# Patient Record
Sex: Male | Born: 1940
Health system: Southern US, Community
[De-identification: ages and names within clinical notes are randomized; demographics above are authoritative.]

## PROBLEM LIST (undated history)

## (undated) DIAGNOSIS — J449 Chronic obstructive pulmonary disease, unspecified: Secondary | ICD-10-CM

## (undated) DIAGNOSIS — F988 Other specified behavioral and emotional disorders with onset usually occurring in childhood and adolescence: Secondary | ICD-10-CM

## (undated) DIAGNOSIS — E785 Hyperlipidemia, unspecified: Secondary | ICD-10-CM

## (undated) DIAGNOSIS — K219 Gastro-esophageal reflux disease without esophagitis: Secondary | ICD-10-CM

## (undated) DIAGNOSIS — I10 Essential (primary) hypertension: Secondary | ICD-10-CM

## (undated) DIAGNOSIS — IMO0001 Reserved for inherently not codable concepts without codable children: Secondary | ICD-10-CM

## (undated) DIAGNOSIS — J302 Other seasonal allergic rhinitis: Secondary | ICD-10-CM

## (undated) DIAGNOSIS — N320 Bladder-neck obstruction: Secondary | ICD-10-CM

## (undated) DIAGNOSIS — M1990 Unspecified osteoarthritis, unspecified site: Secondary | ICD-10-CM

## (undated) DIAGNOSIS — C801 Malignant (primary) neoplasm, unspecified: Secondary | ICD-10-CM

## (undated) DIAGNOSIS — F419 Anxiety disorder, unspecified: Secondary | ICD-10-CM

## (undated) HISTORY — PX: OTHER SURGICAL HISTORY: SHX169

## (undated) HISTORY — PX: HERNIA REPAIR: SHX51

## (undated) HISTORY — DX: Essential (primary) hypertension: I10

## (undated) HISTORY — PX: APPENDECTOMY: SHX54

## (undated) HISTORY — DX: Anxiety disorder, unspecified: F41.9

## (undated) HISTORY — PX: TONSILLECTOMY: SUR1361

---

## 2006-06-07 ENCOUNTER — Ambulatory Visit: Payer: Self-pay | Admitting: Gastroenterology

## 2010-02-20 ENCOUNTER — Emergency Department: Payer: Self-pay | Admitting: Emergency Medicine

## 2010-03-14 ENCOUNTER — Ambulatory Visit: Payer: Self-pay | Admitting: Surgery

## 2010-03-16 ENCOUNTER — Ambulatory Visit: Payer: Self-pay | Admitting: Surgery

## 2010-03-20 ENCOUNTER — Ambulatory Visit: Payer: Self-pay | Admitting: Surgery

## 2011-10-16 ENCOUNTER — Ambulatory Visit: Payer: Self-pay | Admitting: Unknown Physician Specialty

## 2011-10-18 LAB — PATHOLOGY REPORT

## 2013-10-01 DIAGNOSIS — J302 Other seasonal allergic rhinitis: Secondary | ICD-10-CM | POA: Insufficient documentation

## 2013-10-01 DIAGNOSIS — F988 Other specified behavioral and emotional disorders with onset usually occurring in childhood and adolescence: Secondary | ICD-10-CM | POA: Insufficient documentation

## 2013-10-01 DIAGNOSIS — K219 Gastro-esophageal reflux disease without esophagitis: Secondary | ICD-10-CM | POA: Insufficient documentation

## 2013-10-01 DIAGNOSIS — N320 Bladder-neck obstruction: Secondary | ICD-10-CM | POA: Insufficient documentation

## 2013-10-01 DIAGNOSIS — E7800 Pure hypercholesterolemia, unspecified: Secondary | ICD-10-CM | POA: Insufficient documentation

## 2014-01-12 ENCOUNTER — Ambulatory Visit: Payer: Self-pay

## 2014-01-13 DIAGNOSIS — R918 Other nonspecific abnormal finding of lung field: Secondary | ICD-10-CM | POA: Insufficient documentation

## 2014-01-13 DIAGNOSIS — J449 Chronic obstructive pulmonary disease, unspecified: Secondary | ICD-10-CM | POA: Insufficient documentation

## 2014-02-11 DIAGNOSIS — I10 Essential (primary) hypertension: Secondary | ICD-10-CM | POA: Insufficient documentation

## 2014-06-08 DIAGNOSIS — M720 Palmar fascial fibromatosis [Dupuytren]: Secondary | ICD-10-CM | POA: Insufficient documentation

## 2014-12-14 DIAGNOSIS — Z86010 Personal history of colonic polyps: Secondary | ICD-10-CM | POA: Insufficient documentation

## 2014-12-14 DIAGNOSIS — Z860101 Personal history of adenomatous and serrated colon polyps: Secondary | ICD-10-CM | POA: Insufficient documentation

## 2014-12-14 DIAGNOSIS — R490 Dysphonia: Secondary | ICD-10-CM | POA: Insufficient documentation

## 2014-12-14 DIAGNOSIS — IMO0001 Reserved for inherently not codable concepts without codable children: Secondary | ICD-10-CM | POA: Insufficient documentation

## 2015-01-06 ENCOUNTER — Other Ambulatory Visit: Payer: Self-pay | Admitting: Specialist

## 2015-01-06 DIAGNOSIS — R0602 Shortness of breath: Secondary | ICD-10-CM

## 2015-01-06 DIAGNOSIS — R911 Solitary pulmonary nodule: Secondary | ICD-10-CM

## 2015-01-27 ENCOUNTER — Encounter: Payer: Self-pay | Admitting: *Deleted

## 2015-01-28 ENCOUNTER — Encounter
Admission: RE | Disposition: A | Discharge status: Home / Self Care | Payer: Self-pay | Source: Ambulatory Visit | Attending: Unknown Physician Specialty

## 2015-01-28 ENCOUNTER — Encounter: Payer: Self-pay | Admitting: *Deleted

## 2015-01-28 ENCOUNTER — Ambulatory Visit
Admission: RE | Admit: 2015-01-28 | Discharge: 2015-01-28 | Disposition: A | Discharge status: Home / Self Care | Payer: PPO | Source: Ambulatory Visit | Attending: Unknown Physician Specialty | Admitting: Unknown Physician Specialty

## 2015-01-28 ENCOUNTER — Ambulatory Visit: Payer: PPO | Admitting: Anesthesiology

## 2015-01-28 DIAGNOSIS — K640 First degree hemorrhoids: Secondary | ICD-10-CM | POA: Diagnosis not present

## 2015-01-28 DIAGNOSIS — K449 Diaphragmatic hernia without obstruction or gangrene: Secondary | ICD-10-CM | POA: Diagnosis not present

## 2015-01-28 DIAGNOSIS — Z7951 Long term (current) use of inhaled steroids: Secondary | ICD-10-CM | POA: Insufficient documentation

## 2015-01-28 DIAGNOSIS — K5730 Diverticulosis of large intestine without perforation or abscess without bleeding: Secondary | ICD-10-CM | POA: Diagnosis not present

## 2015-01-28 DIAGNOSIS — E785 Hyperlipidemia, unspecified: Secondary | ICD-10-CM | POA: Insufficient documentation

## 2015-01-28 DIAGNOSIS — K210 Gastro-esophageal reflux disease with esophagitis: Secondary | ICD-10-CM | POA: Insufficient documentation

## 2015-01-28 DIAGNOSIS — D127 Benign neoplasm of rectosigmoid junction: Secondary | ICD-10-CM | POA: Diagnosis not present

## 2015-01-28 DIAGNOSIS — J449 Chronic obstructive pulmonary disease, unspecified: Secondary | ICD-10-CM | POA: Diagnosis not present

## 2015-01-28 DIAGNOSIS — K2980 Duodenitis without bleeding: Secondary | ICD-10-CM | POA: Diagnosis not present

## 2015-01-28 DIAGNOSIS — Z87891 Personal history of nicotine dependence: Secondary | ICD-10-CM | POA: Diagnosis not present

## 2015-01-28 DIAGNOSIS — Z79899 Other long term (current) drug therapy: Secondary | ICD-10-CM | POA: Insufficient documentation

## 2015-01-28 DIAGNOSIS — R12 Heartburn: Secondary | ICD-10-CM | POA: Diagnosis present

## 2015-01-28 DIAGNOSIS — K2950 Unspecified chronic gastritis without bleeding: Secondary | ICD-10-CM | POA: Insufficient documentation

## 2015-01-28 HISTORY — DX: Hyperlipidemia, unspecified: E78.5

## 2015-01-28 HISTORY — PX: COLONOSCOPY WITH PROPOFOL: SHX5780

## 2015-01-28 HISTORY — DX: Bladder-neck obstruction: N32.0

## 2015-01-28 HISTORY — PX: ESOPHAGOGASTRODUODENOSCOPY (EGD) WITH PROPOFOL: SHX5813

## 2015-01-28 HISTORY — DX: Gastro-esophageal reflux disease without esophagitis: K21.9

## 2015-01-28 HISTORY — DX: Chronic obstructive pulmonary disease, unspecified: J44.9

## 2015-01-28 HISTORY — DX: Reserved for inherently not codable concepts without codable children: IMO0001

## 2015-01-28 SURGERY — COLONOSCOPY WITH PROPOFOL
Anesthesia: General

## 2015-01-28 MED ORDER — SODIUM CHLORIDE 0.9 % IV SOLN
INTRAVENOUS | Status: DC
  Administered 2015-01-28: 1000 mL via INTRAVENOUS

## 2015-01-28 MED ORDER — IPRATROPIUM-ALBUTEROL 0.5-2.5 (3) MG/3ML IN SOLN
RESPIRATORY_TRACT | Status: AC
  Filled 2015-01-28: qty 3

## 2015-01-28 MED ORDER — SODIUM CHLORIDE 0.9 % IV SOLN
INTRAVENOUS | Status: DC
Start: 2015-01-28 — End: 2015-01-28

## 2015-01-28 MED ORDER — LIDOCAINE HCL (CARDIAC) 20 MG/ML IV SOLN
INTRAVENOUS | Status: DC | PRN
  Administered 2015-01-28: 80 mg via INTRAVENOUS

## 2015-01-28 MED ORDER — IPRATROPIUM-ALBUTEROL 0.5-2.5 (3) MG/3ML IN SOLN
3.0000 mL | Freq: Once | RESPIRATORY_TRACT | Status: AC
Start: 2015-01-28 — End: 2015-01-28
  Administered 2015-01-28: 3 mL via RESPIRATORY_TRACT

## 2015-01-28 MED ORDER — PROPOFOL 500 MG/50ML IV EMUL
INTRAVENOUS | Status: DC | PRN
  Administered 2015-01-28: 160 ug/kg/min via INTRAVENOUS

## 2015-01-28 MED ORDER — MIDAZOLAM HCL 2 MG/2ML IJ SOLN
INTRAMUSCULAR | Status: DC | PRN
  Administered 2015-01-28: 2 mg via INTRAVENOUS

## 2015-01-28 NOTE — H&P (Signed)
Primary Care Physician:  Sherrin Daisy, MD Primary Gastroenterologist:  Dr. Vira Agar  Pre-Procedure History & Physical: HPI:  Arthur Owens is a 74 y.o. male is here for an endoscopy and colonoscopy.   Past Medical History  Diagnosis Date  . Hyperlipidemia   . GERD (gastroesophageal reflux disease)   . COPD (chronic obstructive pulmonary disease)   . Bladder neck obstruction   . Shortness of breath dyspnea     Past Surgical History  Procedure Laterality Date  . Adentamous colon polyp    . Hernia repair    . Left hernia repair Left   . Left humerus debridement Left   . Appendectomy    . Tonsillectomy      Prior to Admission medications   Medication Sig Start Date End Date Taking? Authorizing Arthur Owens  albuterol (PROVENTIL HFA;VENTOLIN HFA) 108 (90 BASE) MCG/ACT inhaler Inhale 2 puffs into the lungs every 6 (six) hours as needed for wheezing or shortness of breath.   Yes Historical Arthur Chestnut, MD  atorvastatin (LIPITOR) 40 MG tablet Take 40 mg by mouth daily.   Yes Historical Arthur Melnik, MD  atorvastatin (LIPITOR) 40 MG tablet Take 40 mg by mouth daily.   Yes Historical Arthur Daw, MD  fluticasone (FLONASE) 50 MCG/ACT nasal spray Place 2 sprays into both nostrils daily.   Yes Historical Arthur Bhattacharyya, MD  hydrochlorothiazide (HYDRODIURIL) 12.5 MG tablet Take 12.5 mg by mouth daily.   Yes Historical Arthur Geissinger, MD  methylphenidate (METADATE CD) 20 MG CR capsule Take 20 mg by mouth every morning.   Yes Historical Arthur Brabant, MD  omeprazole (PRILOSEC) 20 MG capsule Take 20 mg by mouth daily.   Yes Historical Arthur Spielberg, MD  tamsulosin (FLOMAX) 0.4 MG CAPS capsule Take 0.4 mg by mouth.   Yes Historical Arthur Husted, MD  Umeclidinium-Vilanterol (ANORO ELLIPTA) 62.5-25 MCG/INH AEPB Inhale into the lungs.   Yes Historical Arthur Aguillard, MD  beclomethasone (QVAR) 40 MCG/ACT inhaler Inhale 2 puffs into the lungs 2 (two) times daily.    Historical Arthur Maggio, MD    Allergies as of 12/21/2014  . (Not on File)     History reviewed. No pertinent family history.  Social History   Social History  . Marital Status: Married    Spouse Name: N/A  . Number of Children: N/A  . Years of Education: N/A   Occupational History  . Not on file.   Social History Main Topics  . Smoking status: Former Research scientist (life sciences)  . Smokeless tobacco: Not on file  . Alcohol Use: Not on file  . Drug Use: Not on file  . Sexual Activity: Not on file   Other Topics Concern  . Not on file   Social History Narrative    Review of Systems: See HPI, otherwise negative ROS  Physical Exam: BP 152/99 mmHg  Pulse 75  Temp(Src) 96.9 F (36.1 C) (Tympanic)  Resp 20  Ht 5\' 7"  (1.702 m)  Wt 89.812 kg (198 lb)  BMI 31.00 kg/m2  SpO2 96% General:   Alert,  pleasant and cooperative in NAD Head:  Normocephalic and atraumatic. Neck:  Supple; no masses or thyromegaly. Lungs:  Clear throughout to auscultation.    Heart:  Regular rate and rhythm. Abdomen:  Soft, nontender and nondistended. Normal bowel sounds, without guarding, and without rebound.   Neurologic:  Alert and  oriented x4;  grossly normal neurologically.  Impression/Plan: Arthur Owens is here for an endoscopy and colonoscopy to be performed for [facility] colon polyps, Heartburn  Risks, benefits, limitations, and alternatives regarding  endoscopy  and colonoscopy have been reviewed with the patient.  Questions have been answered.  All parties agreeable.   Gaylyn Cheers, MD  01/28/2015, 11:16 AM

## 2015-01-28 NOTE — Anesthesia Preprocedure Evaluation (Signed)
Anesthesia Evaluation  Patient identified by MRN, date of birth, ID band Patient awake    Reviewed: Allergy & Precautions, H&P , NPO status , Patient's Chart, lab work & pertinent test results  History of Anesthesia Complications Negative for: history of anesthetic complications  Airway Mallampati: II  TM Distance: >3 FB Neck ROM: limited    Dental  (+) Poor Dentition   Pulmonary neg shortness of breath, COPD, former smoker,    Pulmonary exam normal breath sounds clear to auscultation       Cardiovascular Exercise Tolerance: Good (-) Past MI Normal cardiovascular exam Rhythm:regular Rate:Normal     Neuro/Psych negative neurological ROS  negative psych ROS   GI/Hepatic Neg liver ROS, GERD  Controlled,  Endo/Other  negative endocrine ROS  Renal/GU negative Renal ROS  negative genitourinary   Musculoskeletal   Abdominal   Peds  Hematology negative hematology ROS (+)   Anesthesia Other Findings Past Medical History:   Hyperlipidemia                                               GERD (gastroesophageal reflux disease)                       COPD (chronic obstructive pulmonary disease)                 Bladder neck obstruction                                     Shortness of breath dyspnea                                  Reproductive/Obstetrics negative OB ROS                             Anesthesia Physical Anesthesia Plan  ASA: III  Anesthesia Plan: General   Post-op Pain Management:    Induction:   Airway Management Planned:   Additional Equipment:   Intra-op Plan:   Post-operative Plan:   Informed Consent: I have reviewed the patients History and Physical, chart, labs and discussed the procedure including the risks, benefits and alternatives for the proposed anesthesia with the patient or authorized representative who has indicated his/her understanding and acceptance.    Dental Advisory Given  Plan Discussed with: Anesthesiologist, CRNA and Surgeon  Anesthesia Plan Comments:         Anesthesia Quick Evaluation

## 2015-01-28 NOTE — Op Note (Signed)
[facility] Gastroenterology Patient Name: Arthur Owens Procedure Date: 01/28/2015 11:15 AM MRN: [id number] Account #: [id number] Date of Birth: May 07, 1940 Admit Type: Outpatient Age: 74 Room: [facility] ENDO ROOM 1 Gender: Male Note Status: Finalized Procedure:         Upper GI endoscopy Indications:       Heartburn Providers:         Manya Silvas, MD Referring MD:      Shirline Frees (Referring MD) Medicines:         Propofol per Anesthesia Complications:     No immediate complications. Procedure:         Pre-Anesthesia Assessment:                    - After reviewing the risks and benefits, the patient was                     deemed in satisfactory condition to undergo the procedure.                    After obtaining informed consent, the endoscope was passed                     under direct vision. Throughout the procedure, the                     patient's blood pressure, pulse, and oxygen saturations                     were monitored continuously. The Endoscope was introduced                     through the mouth, and advanced to the second part of                     duodenum. The upper GI endoscopy was accomplished without                     difficulty. The patient tolerated the procedure well. Findings:      LA Grade A (one or more mucosal breaks less than 5 mm, not extending       between tops of 2 mucosal folds) esophagitis with no bleeding was found       38 cm from the incisors. Biopsies were taken with a cold forceps for       histology.      A small hiatus hernia was present.      Diffuse mild inflammation characterized by erythema and granularity was       found in the gastric body. Biopsies were taken with a cold forceps for       histology. Biopsies were taken with a cold forceps for Helicobacter       pylori testing.      Patchy mild inflammation characterized by erythema and granularity was       found in the duodenal bulb.      The  2nd part of the duodenum was normal. Impression:        - LA Grade A reflux esophagitis. Rule out Barrett's                     esophagus. Biopsied.                    - Small hiatus hernia.                    -  Gastritis. Biopsied.                    - Duodenitis.                    - Normal 2nd part of the duodenum. Recommendation:    - Await pathology results. Manya Silvas, MD 01/28/2015 11:31:42 AM This report has been signed electronically. Number of Addenda: 0 Note Initiated On: 01/28/2015 11:15 AM      [facility]

## 2015-01-28 NOTE — Transfer of Care (Signed)
Immediate Anesthesia Transfer of Care Note  Patient: Arthur Owens  Procedure(s) Performed: Procedure(s): COLONOSCOPY WITH PROPOFOL (N/A) ESOPHAGOGASTRODUODENOSCOPY (EGD) WITH PROPOFOL (N/A)  Patient Location: PACU and Endoscopy Unit  Anesthesia Type:General  Level of Consciousness: sedated  Airway & Oxygen Therapy: Patient Spontanous Breathing and Patient connected to nasal cannula oxygen  Post-op Assessment: Report given to RN and Post -op Vital signs reviewed and stable  Post vital signs: Reviewed and stable  Last Vitals: 97% 77hr 12 resp 88/58  Filed Vitals:   01/28/15 1038  BP: 152/99  Pulse: 75  Temp: 36.1 C  Resp: 20    Complications: No apparent anesthesia complications

## 2015-01-28 NOTE — Op Note (Signed)
[facility] Gastroenterology Patient Name: Arthur Owens Procedure Date: 01/28/2015 11:16 AM MRN: [id number] Account #: [id number] Date of Birth: Oct 23, 1940 Admit Type: Outpatient Age: 74 Room: [facility] ENDO ROOM 1 Gender: Male Note Status: Finalized Procedure:         Colonoscopy Indications:       Personal history of colonic polyps Providers:         Manya Silvas, MD Referring MD:      Shirline Frees (Referring MD) Medicines:         Propofol per Anesthesia Complications:     No immediate complications. Procedure:         Pre-Anesthesia Assessment:                    - After reviewing the risks and benefits, the patient was                     deemed in satisfactory condition to undergo the procedure.                    After obtaining informed consent, the colonoscope was                     passed under direct vision. Throughout the procedure, the                     patient's blood pressure, pulse, and oxygen saturations                     were monitored continuously. The Colonoscope was                     introduced through the anus and advanced to the the cecum,                     identified by appendiceal orifice and ileocecal valve. The                     colonoscopy was performed without difficulty. The patient                     tolerated the procedure well. The quality of the bowel                     preparation was excellent. Findings:      A few sessile polyps were found in the recto-sigmoid colon. The polyps       were diminutive in size. These polyps were removed with a jumbo cold       forceps. Resection and retrieval were complete.      Internal hemorrhoids were found during endoscopy. The hemorrhoids were       small and Grade I (internal hemorrhoids that do not prolapse).      Many medium-mouthed diverticula were found in the sigmoid colon.      Internal hemorrhoids were found during endoscopy. The hemorrhoids were       small and  Grade I (internal hemorrhoids that do not prolapse).      The exam was otherwise without abnormality. Impression:        - A few diminutive polyps at the recto-sigmoid colon.                     Resected and retrieved.                    -  Internal hemorrhoids.                    - Diverticulosis in the sigmoid colon.                    - Internal hemorrhoids.                    - The examination was otherwise normal. Recommendation:    - Await pathology results. Manya Silvas, MD 01/28/2015 11:54:13 AM This report has been signed electronically. Number of Addenda: 0 Note Initiated On: 01/28/2015 11:16 AM Scope Withdrawal Time: 0 hours 11 minutes 28 seconds  Total Procedure Duration: 0 hours 16 minutes 42 seconds       [facility]

## 2015-01-28 NOTE — Anesthesia Postprocedure Evaluation (Signed)
  Anesthesia Post-op Note  Patient: Arthur Owens  Procedure(s) Performed: Procedure(s): COLONOSCOPY WITH PROPOFOL (N/A) ESOPHAGOGASTRODUODENOSCOPY (EGD) WITH PROPOFOL (N/A)  Anesthesia type:General  Patient location: PACU  Post pain: Pain level controlled  Post assessment: Post-op Vital signs reviewed, Patient's Cardiovascular Status Stable, Respiratory Function Stable, Patent Airway and No signs of Nausea or vomiting  Post vital signs: Reviewed and stable  Last Vitals:  Filed Vitals:   01/28/15 1225  BP: 132/87  Pulse: 69  Temp:   Resp: 20    Level of consciousness: awake, alert  and patient cooperative  Complications: No apparent anesthesia complications

## 2015-01-31 LAB — SURGICAL PATHOLOGY

## 2015-02-14 ENCOUNTER — Ambulatory Visit
Admission: RE | Admit: 2015-02-14 | Discharge: 2015-02-14 | Disposition: A | Discharge status: Home / Self Care | Payer: PPO | Source: Ambulatory Visit | Attending: Specialist | Admitting: Specialist

## 2015-02-14 DIAGNOSIS — R911 Solitary pulmonary nodule: Secondary | ICD-10-CM | POA: Insufficient documentation

## 2015-02-14 DIAGNOSIS — R0602 Shortness of breath: Secondary | ICD-10-CM | POA: Diagnosis present

## 2015-02-18 ENCOUNTER — Encounter: Payer: Self-pay | Admitting: Unknown Physician Specialty

## 2015-04-15 ENCOUNTER — Ambulatory Visit: Payer: PPO | Admitting: Anesthesiology

## 2015-04-15 ENCOUNTER — Ambulatory Visit
Admission: RE | Admit: 2015-04-15 | Discharge: 2015-04-15 | Disposition: A | Discharge status: Home / Self Care | Payer: PPO | Source: Ambulatory Visit | Attending: Unknown Physician Specialty | Admitting: Unknown Physician Specialty

## 2015-04-15 ENCOUNTER — Encounter
Admission: RE | Disposition: A | Discharge status: Home / Self Care | Payer: Self-pay | Source: Ambulatory Visit | Attending: Unknown Physician Specialty

## 2015-04-15 DIAGNOSIS — M720 Palmar fascial fibromatosis [Dupuytren]: Secondary | ICD-10-CM | POA: Diagnosis present

## 2015-04-15 DIAGNOSIS — Z885 Allergy status to narcotic agent status: Secondary | ICD-10-CM | POA: Diagnosis not present

## 2015-04-15 DIAGNOSIS — Z87891 Personal history of nicotine dependence: Secondary | ICD-10-CM | POA: Diagnosis not present

## 2015-04-15 DIAGNOSIS — K219 Gastro-esophageal reflux disease without esophagitis: Secondary | ICD-10-CM | POA: Diagnosis not present

## 2015-04-15 DIAGNOSIS — J449 Chronic obstructive pulmonary disease, unspecified: Secondary | ICD-10-CM | POA: Diagnosis not present

## 2015-04-15 HISTORY — DX: Other seasonal allergic rhinitis: J30.2

## 2015-04-15 HISTORY — DX: Other specified behavioral and emotional disorders with onset usually occurring in childhood and adolescence: F98.8

## 2015-04-15 HISTORY — DX: Malignant (primary) neoplasm, unspecified: C80.1

## 2015-04-15 HISTORY — PX: TRIGGER FINGER RELEASE: SHX641

## 2015-04-15 SURGERY — RELEASE, A1 PULLEY, FOR TRIGGER FINGER
Anesthesia: General | Laterality: Left | Wound class: Clean

## 2015-04-15 MED ORDER — PROPOFOL 10 MG/ML IV BOLUS
INTRAVENOUS | Status: DC | PRN
  Administered 2015-04-15: 150 mg via INTRAVENOUS

## 2015-04-15 MED ORDER — LACTATED RINGERS IV SOLN
INTRAVENOUS | Status: DC
  Administered 2015-04-15: 08:00:00 via INTRAVENOUS

## 2015-04-15 MED ORDER — DEXAMETHASONE SODIUM PHOSPHATE 4 MG/ML IJ SOLN
INTRAMUSCULAR | Status: DC | PRN
  Administered 2015-04-15: 8 mg via INTRAVENOUS

## 2015-04-15 MED ORDER — FENTANYL CITRATE (PF) 100 MCG/2ML IJ SOLN
25.0000 ug | INTRAMUSCULAR | Status: DC | PRN
  Administered 2015-04-15 (×2): 25 ug via INTRAVENOUS

## 2015-04-15 MED ORDER — FENTANYL CITRATE (PF) 100 MCG/2ML IJ SOLN
INTRAMUSCULAR | Status: DC | PRN
Start: 2015-04-15 — End: 2015-04-15
  Administered 2015-04-15: 50 ug via INTRAVENOUS

## 2015-04-15 MED ORDER — ONDANSETRON HCL 4 MG/2ML IJ SOLN
INTRAMUSCULAR | Status: DC | PRN
  Administered 2015-04-15: 4 mg via INTRAVENOUS

## 2015-04-15 MED ORDER — ONDANSETRON HCL 4 MG/2ML IJ SOLN
4.0000 mg | Freq: Once | INTRAMUSCULAR | Status: AC | PRN
  Administered 2015-04-15: 4 mg via INTRAVENOUS

## 2015-04-15 MED ORDER — MIDAZOLAM HCL 5 MG/5ML IJ SOLN
INTRAMUSCULAR | Status: DC | PRN
  Administered 2015-04-15: 2 mg via INTRAVENOUS

## 2015-04-15 MED ORDER — LIDOCAINE HCL (CARDIAC) 20 MG/ML IV SOLN
INTRAVENOUS | Status: DC | PRN
  Administered 2015-04-15: 40 mg via INTRATRACHEAL

## 2015-04-15 SURGICAL SUPPLY — 27 items
BANDAGE ELASTIC 2 CLIP NS LF (GAUZE/BANDAGES/DRESSINGS) ×3 IMPLANT
BNDG ESMARK 4X12 TAN STRL LF (GAUZE/BANDAGES/DRESSINGS) ×3 IMPLANT
COVER LIGHT HANDLE FLEXIBLE (MISCELLANEOUS) ×6 IMPLANT
CUFF TOURN SGL QUICK 18 (TOURNIQUET CUFF) ×3 IMPLANT
DURAPREP 26ML APPLICATOR (WOUND CARE) ×3 IMPLANT
GAUZE SPONGE 4X4 12PLY STRL (GAUZE/BANDAGES/DRESSINGS) ×3 IMPLANT
GLOVE BIO SURGEON STRL SZ7.5 (GLOVE) ×3 IMPLANT
GLOVE BIO SURGEON STRL SZ8 (GLOVE) ×6 IMPLANT
GLOVE INDICATOR 8.0 STRL GRN (GLOVE) ×3 IMPLANT
GOWN STRL REUS W/ TWL LRG LVL3 (GOWN DISPOSABLE) ×2 IMPLANT
GOWN STRL REUS W/TWL LRG LVL3 (GOWN DISPOSABLE) ×4
KIT ROOM TURNOVER OR (KITS) ×3 IMPLANT
LOOP VESSEL RED MINI 1.3X0.9 (MISCELLANEOUS) ×1 IMPLANT
LOOPS RED MINI 1.3MMX0.9MM (MISCELLANEOUS) ×2
NS IRRIG 500ML POUR BTL (IV SOLUTION) ×3 IMPLANT
PACK EXTREMITY ARMC (MISCELLANEOUS) ×3 IMPLANT
PAD GROUND ADULT SPLIT (MISCELLANEOUS) ×3 IMPLANT
PADDING CAST 2X4YD ST (MISCELLANEOUS) ×2
PADDING CAST BLEND 2X4 STRL (MISCELLANEOUS) ×1 IMPLANT
SOL PREP PVP 2OZ (MISCELLANEOUS) ×3
SOLUTION PREP PVP 2OZ (MISCELLANEOUS) ×1 IMPLANT
SPLINT CAST 1 STEP 3X12 (MISCELLANEOUS) ×3 IMPLANT
STOCKINETTE 4X48 STRL (DRAPES) ×3 IMPLANT
STRAP BODY AND KNEE 60X3 (MISCELLANEOUS) ×3 IMPLANT
SUT ETHILON 4-0 (SUTURE) ×12
SUT ETHILON 4-0 FS2 18XMFL BLK (SUTURE) ×6
SUTURE ETHLN 4-0 FS2 18XMF BLK (SUTURE) ×6 IMPLANT

## 2015-04-15 NOTE — Transfer of Care (Signed)
Immediate Anesthesia Transfer of Care Note  Patient: Arthur Owens  Procedure(s) Performed: Procedure(s): LEFT RING FINGER DUPUYTREN RELEASE (Left)  Patient Location: PACU  Anesthesia Type: General LMA  Level of Consciousness: awake, alert  and patient cooperative  Airway and Oxygen Therapy: Patient Spontanous Breathing and Patient connected to supplemental oxygen  Post-op Assessment: Post-op Vital signs reviewed, Patient's Cardiovascular Status Stable, Respiratory Function Stable, Patent Airway and No signs of Nausea or vomiting  Post-op Vital Signs: Reviewed and stable  Complications: No apparent anesthesia complications

## 2015-04-15 NOTE — Anesthesia Procedure Notes (Signed)
Procedure Name: LMA Insertion Date/Time: 04/15/2015 9:33 AM Performed by: Londell Moh Pre-anesthesia Checklist: Patient identified, Emergency Drugs available, Suction available, Timeout performed and Patient being monitored Patient Re-evaluated:Patient Re-evaluated prior to inductionOxygen Delivery Method: Circle system utilized Preoxygenation: Pre-oxygenation with 100% oxygen Intubation Type: IV induction LMA: LMA inserted LMA Size: 4.0 Number of attempts: 1 Placement Confirmation: positive ETCO2 and breath sounds checked- equal and bilateral Tube secured with: Tape

## 2015-04-15 NOTE — H&P (Signed)
  H and P reviewed. No changes. Uploaded at later date. 

## 2015-04-15 NOTE — Anesthesia Preprocedure Evaluation (Signed)
Anesthesia Evaluation  Patient identified by MRN, date of birth, ID band  Reviewed: Allergy & Precautions, H&P , NPO status , Patient's Chart, lab work & pertinent test results  Airway Mallampati: II  TM Distance: >3 FB Neck ROM: full    Dental no notable dental hx.    Pulmonary COPD,  COPD inhaler, former smoker,    Pulmonary exam normal        Cardiovascular  Rhythm:regular Rate:Normal     Neuro/Psych    GI/Hepatic GERD  ,  Endo/Other    Renal/GU      Musculoskeletal   Abdominal   Peds  Hematology   Anesthesia Other Findings   Reproductive/Obstetrics                             Anesthesia Physical Anesthesia Plan  ASA: II  Anesthesia Plan: General LMA   Post-op Pain Management:    Induction:   Airway Management Planned:   Additional Equipment:   Intra-op Plan:   Post-operative Plan:   Informed Consent: I have reviewed the patients History and Physical, chart, labs and discussed the procedure including the risks, benefits and alternatives for the proposed anesthesia with the patient or authorized representative who has indicated his/her understanding and acceptance.     Plan Discussed with: CRNA  Anesthesia Plan Comments:         Anesthesia Quick Evaluation

## 2015-04-15 NOTE — Anesthesia Postprocedure Evaluation (Signed)
Anesthesia Post Note  Patient: Arthur Owens  Procedure(s) Performed: Procedure(s) (LRB): LEFT RING FINGER DUPUYTREN RELEASE (Left)  Patient location during evaluation: PACU Anesthesia Type: General Level of consciousness: awake and alert and oriented Pain management: satisfactory to patient Vital Signs Assessment: post-procedure vital signs reviewed and stable Respiratory status: spontaneous breathing, nonlabored ventilation and respiratory function stable Cardiovascular status: blood pressure returned to baseline and stable Postop Assessment: Adequate PO intake and No signs of nausea or vomiting Anesthetic complications: no    Raliegh Ip

## 2015-04-15 NOTE — Discharge Instructions (Signed)
General Anesthesia, Adult, Care After Refer to this sheet in the next few weeks. These instructions provide you with information on caring for yourself after your procedure. Your health care provider may also give you more specific instructions. Your treatment has been planned according to current medical practices, but problems sometimes occur. Call your health care provider if you have any problems or questions after your procedure. WHAT TO EXPECT AFTER THE PROCEDURE After the procedure, it is typical to experience:  Sleepiness.  Nausea and vomiting. HOME CARE INSTRUCTIONS  For the first 24 hours after general anesthesia:  Have a responsible person with you.  Do not drive a car. If you are alone, do not take public transportation.  Do not drink alcohol.  Do not take medicine that has not been prescribed by your health care provider.  Do not sign important papers or make important decisions.  You may resume a normal diet and activities as directed by your health care provider.  Change bandages (dressings) as directed.  If you have questions or problems that seem related to general anesthesia, call the hospital and ask for the anesthetist or anesthesiologist on call. SEEK MEDICAL CARE IF:  You have nausea and vomiting that continue the day after anesthesia.  You develop a rash. SEEK IMMEDIATE MEDICAL CARE IF:   You have difficulty breathing.  You have chest pain.  You have any allergic problems.   This information is not intended to replace advice given to you by your health care provider. Make sure you discuss any questions you have with your health care provider.   Document Released: 07/23/2000 Document Revised: 05/07/2014 Document Reviewed: 08/15/2011 Elsevier Interactive Patient Education 2016 [employer].   Elevation  RTC in about 10 days  Keep dressing dry

## 2015-04-15 NOTE — Op Note (Signed)
04/15/2015  11:56 AM  PATIENT:  Arthur Owens  74 y.o. male  PRE-OPERATIVE DIAGNOSIS: Dupuytren's contracture left ring finger  POST-OPERATIVE DIAGNOSIS:  Same  PROCEDURE:  Procedure(s): LEFT RING FINGER DUPUYTREN RELEASE (Left)  SURGEON:   Leanor Kail, Brooke Bonito., MD  ASSISTANTS: None   HISTORY: The patient had a long history of Dupuytren's contracture of his left ring finger. Because of his significant flexion deformity he was brought in for surgical release of his left ring finger Dupuytren's contracture.  OP NOTE: The patient was taken to the operating room where satisfactory general anesthesia was achieved. A tourniquet was applied to his left upper arm. The left upper extremity was prepped and draped in usual fashion for a procedure about the hand. The left upper extremity was exsanguinated and the tourniquet was inflated. I then made a Z type incision in the skin overlying the Dupuytren's contracture. Incision began in the proximal palm and extended to the PIP joint of the left ring finger. I divided the contracture starting in the proximal palm and then worked from proximal to distal bluntly and sharply dissecting the Dupuytren's band from the underlying soft tissue. Care was taken to protect the digital nerves and arteries. I was able to excise the contracted tissue in its entirety. At this time almost full passive extension could be achieved referable to the left ring finger. The tourniquet was released at this time. It was up about 56 minutes. Bleeding was controlled with digital pressure and coagulation cautery. I closed the incision with multiple interrupted 4-0 nylon sutures. I used some apical stitches as well as some simple stitches and vertical mattress stitches. I then applied Betadine and Xeroform gauze to the wound. Bulky compression hand dressing was applied that was reinforced with a fiberglass volar splint.  The patient was then awakened and transferred to his stretcher  bed. He was taken to the recovery room in satisfactory condition.

## 2015-04-18 ENCOUNTER — Encounter: Payer: Self-pay | Admitting: Unknown Physician Specialty

## 2015-04-26 DIAGNOSIS — M720 Palmar fascial fibromatosis [Dupuytren]: Secondary | ICD-10-CM | POA: Insufficient documentation

## 2015-05-11 ENCOUNTER — Ambulatory Visit: Payer: PPO | Attending: Unknown Physician Specialty | Admitting: Occupational Therapy

## 2015-05-11 DIAGNOSIS — M6281 Muscle weakness (generalized): Secondary | ICD-10-CM | POA: Diagnosis not present

## 2015-05-11 DIAGNOSIS — M25642 Stiffness of left hand, not elsewhere classified: Secondary | ICD-10-CM

## 2015-05-11 DIAGNOSIS — L905 Scar conditions and fibrosis of skin: Secondary | ICD-10-CM | POA: Diagnosis not present

## 2015-05-11 NOTE — Patient Instructions (Signed)
Fabricated dorsal splint for 4th and 5th digits extention - to wear all the time  Off with HEP  Contrast 3 min heat , 1 min ice - x 2 and heat 3 min  Scar massage for areas without scabs  PROM of 4th DIP and PIP PROM composite flexion of each digit Tendon glides

## 2015-05-11 NOTE — Therapy (Signed)
[facility] [address], [place], [address] Phone: [phone]   Fax:  [phone]  Occupational Therapy Treatment  Patient Details  Name: Arthur Owens MRN: [id number] Date of Birth: July 12, 1940 Referring Provider: Leanor Kail  Encounter Date: 05/11/2015      OT End of Session - 05/11/15 1739    Visit Number 1   Number of Visits 12   Date for OT Re-Evaluation 06/22/15   OT Start Time 1324   OT Stop Time 1426   OT Time Calculation (min) 62 min   Activity Tolerance Patient tolerated treatment well   Behavior During Therapy [facility] for tasks assessed/performed      Past Medical History  Diagnosis Date  . Hyperlipidemia   . GERD (gastroesophageal reflux disease)   . COPD (chronic obstructive pulmonary disease) ([place])   . Bladder neck obstruction   . Shortness of breath dyspnea   . ADD (attention deficit disorder)   . Cancer ([place])     H/O ADENOMATOUS POLYP OF COLON  . Seasonal allergies     Past Surgical History  Procedure Laterality Date  . Adentamous colon polyp    . Left humerus debridement Left   . Appendectomy    . Tonsillectomy    . Colonoscopy with propofol N/A 01/28/2015    Procedure: COLONOSCOPY WITH PROPOFOL;  Surgeon: Manya Silvas, MD;  Location: [facility] ENDOSCOPY;  Service: Endoscopy;  Laterality: N/A;  . Esophagogastroduodenoscopy (egd) with propofol N/A 01/28/2015    Procedure: ESOPHAGOGASTRODUODENOSCOPY (EGD) WITH PROPOFOL;  Surgeon: Manya Silvas, MD;  Location: [facility] ENDOSCOPY;  Service: Endoscopy;  Laterality: N/A;  . Hernia repair Left     LEFT INGUINAL  . Trigger finger release Left 04/15/2015    Procedure: LEFT RING FINGER DUPUYTREN RELEASE;  Surgeon: Leanor Kail, MD;  Location: [place];  Service: Orthopedics;  Laterality: Left;    There were no vitals filed for this visit.  Visit Diagnosis:  Stiffness of finger joint of left hand - Plan: Ot plan of care  cert/re-cert  Scar condition and fibrosis of skin - Plan: Ot plan of care cert/re-cert  Muscle weakness - Plan: Ot plan of care cert/re-cert      Subjective Assessment - 05/11/15 1731    Subjective  The stitches come out the 30Dec - my wife helping me rub on cocoa butter - but still loose skin , swollen - cannot use my hand really in gripping objects- cannot make fist    Patient Stated Goals Want to play pickle ball again , work in yard and around the house - anything that I need to make fist    Currently in Pain? No/denies            [facility] OT Assessment - 05/11/15 0001    Assessment   Diagnosis L duPuytrens release    Referring Provider Leanor Kail   Onset Date 04/15/15   Assessment Pt present about 3 1/2 wks postop from dupuytrens release - pt  still has areas of scabbs and dry sking  removed some - still 3 scabs that kept on - swollen over hand - pt ed on contrast - and removed dry skin    Balance Screen   Has the patient fallen in the past 6 months No   Has the patient had a decrease in activity level because of a fear of falling?  No   Is the patient reluctant to leave their home because of a fear  of falling?  No   Home  Environment   Lives With Spouse   Prior Function   Level of Independence Independent   Vocation Retired   Leisure Pt likes to hike, work around [employer] and yard, Geneticist, molecular or tennis but did not in while because of hands -    Left Hand AROM   L Index  MCP 0-90 90 Degrees   L Index PIP 0-100 80 Degrees   L Long  MCP 0-90 85 Degrees   L Long PIP 0-100 70 Degrees   L Ring  MCP 0-90 75 Degrees   L Ring PIP 0-100 70 Degrees   L Little  MCP 0-90 75 Degrees   L Little PIP 0-100 70 Degrees            Fabricate hand base dorsal extention splint for 4th and 5th digits - to wear all the time - ed on precautions ad off for HEP                OT Education - 05/11/15 1738    Education provided Yes   Education Details HEP - splint  wearing    Person(s) Educated Patient   Methods Explanation;Demonstration;Tactile cues;Verbal cues;Handout   Comprehension Verbal cues required;Returned demonstration;Verbalized understanding;Tactile cues required          OT Short Term Goals - 05/11/15 1742    OT SHORT TERM GOAL #1   Title Scar healing improve for pt to tolerate all textures and massage to scar and use of tools with out increase pain    Baseline still some dry skin and scabs - tender    Time 4   Period Weeks   Status New   OT SHORT TERM GOAL #2   Title Pain on PRWHE improve with about 10 points   Baseline Pain on PRWHE 19/50   Time 3   Period Weeks   Status New           OT Long Term Goals - 05/11/15 1744    OT LONG TERM GOAL #1   Title AROM in all digits improve for pt to touch palm to make full fist to hold knife   Baseline MC 75 flexion 4th hand 5th ; PIP70 to 80 degrees    Time 6   Period Weeks   Status New   OT LONG TERM GOAL #2   Title Grip strength in L hand improve to about 50% compare to R hand to hold knife and carry groceries    Baseline grip NT    Time 6   Period Weeks   Status New               Plan - 05/11/15 1740    Clinical Impression Statement Pt present 3 1/2 wks postop from dupuytrens release - pt still areas of scar that is healing - increase edema - and decrease flexion of all digits, increase pain with PROM , gripping , decrease extention of 4th digits and decrease grip strength limiting his functional use of L hand in ADl's and IADL's - pt is R hand dominant    Pt will benefit from skilled therapeutic intervention in order to improve on the following deficits (Retired) Impaired UE functional use;Pain;Decreased strength;Decreased scar mobility;Increased edema;Impaired flexibility;Decreased range of motion   Rehab Potential Good   OT Frequency 2x / week   OT Duration 6 weeks   OT Treatment/Interventions Contrast Bath;Fluidtherapy;Parrafin;Ultrasound;Therapeutic  exercise;Patient/family education;Splinting;Manual Therapy;Scar mobilization;Passive range of motion  Plan assess use and fit of splint - HEP    Consulted and Agree with Plan of Care Patient          G-Codes - May 29, 2015 1747    Functional Assessment Tool Used PRWHE , ROM , grip and prehension, pain    Self Care Current Status [id number]) At least 40 percent but less than 60 percent impaired, limited or restricted   Self Care Goal Status [id number]) At least 1 percent but less than 20 percent impaired, limited or restricted      Problem List There are no active problems to display for this patient.   Rosalyn Gess OTR/L,CLT 2015/05/29, 5:52 PM  [place] [facility] [address], [place], [address] Phone: [phone]   Fax:  [phone]  Name: Arthur Owens MRN: [id number] Date of Birth: 05-20-1940

## 2015-05-16 ENCOUNTER — Ambulatory Visit: Payer: PPO | Admitting: Occupational Therapy

## 2015-05-16 DIAGNOSIS — M6281 Muscle weakness (generalized): Secondary | ICD-10-CM

## 2015-05-16 DIAGNOSIS — M25642 Stiffness of left hand, not elsewhere classified: Secondary | ICD-10-CM

## 2015-05-16 DIAGNOSIS — L905 Scar conditions and fibrosis of skin: Secondary | ICD-10-CM

## 2015-05-16 NOTE — Patient Instructions (Signed)
Same HEP but reinforce AAROM to [facility] flexion , PROM to [facility] flexion  Reinforce to maintain MC flexion during composite flexion   Scar pad for night time  and silicon digi sleeves for 4th digit during day and night time use

## 2015-05-16 NOTE — Therapy (Signed)
[place] [facility] [address] [place], [address] Phone: [phone]   Fax:  [phone]  Occupational Therapy Treatment  Patient Details  Name: Arthur Owens MRN: [id number] Date of Birth: 1940/09/23 Referring Provider: Leanor Kail  Encounter Date: 05/16/2015      OT End of Session - 05/16/15 1637    Visit Number 2   Number of Visits 12   Date for OT Re-Evaluation 06/22/15   OT Start Time 0940   OT Stop Time 1025   OT Time Calculation (min) 45 min   Activity Tolerance Patient tolerated treatment well   Behavior During Therapy [facility] for tasks assessed/performed      Past Medical History  Diagnosis Date  . Hyperlipidemia   . GERD (gastroesophageal reflux disease)   . COPD (chronic obstructive pulmonary disease) ([place])   . Bladder neck obstruction   . Shortness of breath dyspnea   . ADD (attention deficit disorder)   . Cancer ([place])     H/O ADENOMATOUS POLYP OF COLON  . Seasonal allergies     Past Surgical History  Procedure Laterality Date  . Adentamous colon polyp    . Left humerus debridement Left   . Appendectomy    . Tonsillectomy    . Colonoscopy with propofol N/A 01/28/2015    Procedure: COLONOSCOPY WITH PROPOFOL;  Surgeon: Manya Silvas, MD;  Location: [facility] ENDOSCOPY;  Service: Endoscopy;  Laterality: N/A;  . Esophagogastroduodenoscopy (egd) with propofol N/A 01/28/2015    Procedure: ESOPHAGOGASTRODUODENOSCOPY (EGD) WITH PROPOFOL;  Surgeon: Manya Silvas, MD;  Location: [facility] ENDOSCOPY;  Service: Endoscopy;  Laterality: N/A;  . Hernia repair Left     LEFT INGUINAL  . Trigger finger release Left 04/15/2015    Procedure: LEFT RING FINGER DUPUYTREN RELEASE;  Surgeon: Leanor Kail, MD;  Location: [place];  Service: Orthopedics;  Laterality: Left;    There were no vitals filed for this visit.  Visit Diagnosis:  Stiffness of finger joint of left hand  Scar condition and  fibrosis of skin  Muscle weakness      Subjective Assessment - 05/16/15 1626    Subjective  It is much better - I still have 2-3 areas where there are scabs - and then it is just frustrated for me to struggle to do things with one hand - cutting food, buttons, dressing and bathing - also not walking/hiking    Patient Stated Goals Want to play pickle ball again , work in yard and around the house - anything that I need to make fist    Currently in Pain? No/denies                      OT Treatments/Exercises (OP) - 05/16/15 0001    ADLs   ADL Comments Built up handsle provided for pt 's fork to be able to cut own food - pt to take off splint to do buttons , bathing and dression    Hand Exercises   Other Hand Exercises AAROM and PROM of MC flexion , AROM intrinsic fist AROM ; AROM composite fist to 2 fingers out of palm , one and then AAROM to palm - place and hold  10 reps each    Other Hand Exercises Edema over MC's and volar plate limiting composite fist - pt need min A for MC flexion and composite    LUE Contrast Bath   Time 11 minutes   Comments decrease edema  and pain over MC's and volar plate to increase MC flexion    Splinting   Splinting Cont dorsal hand base extnetion splint for 4th and 5th digits    Manual Therapy   Manual therapy comments Scar massage and mobs done - pt  to stay away from 2 areas of scabs - CIca care scar pad for proximal scar - for night time use - as well as digisleeve with silicion for night time  and some daytime use - to decrease scar tissue                 OT Education - 05/16/15 1635    Education provided Yes   Education Details HEP , pt instruction    Person(s) Educated Patient   Methods Explanation;Demonstration;Tactile cues;Verbal cues   Comprehension Verbalized understanding;Returned demonstration;Verbal cues required          OT Short Term Goals - 05/11/15 1742    OT SHORT TERM GOAL #1   Title Scar healing improve  for pt to tolerate all textures and massage to scar and use of tools with out increase pain    Baseline still some dry skin and scabs - tender    Time 4   Period Weeks   Status New   OT SHORT TERM GOAL #2   Title Pain on PRWHE improve with about 10 points   Baseline Pain on PRWHE 19/50   Time 3   Period Weeks   Status New           OT Long Term Goals - 05/11/15 1744    OT LONG TERM GOAL #1   Title AROM in all digits improve for pt to touch palm to make full fist to hold knife   Baseline MC 75 flexion 4th hand 5th ; PIP70 to 80 degrees    Time 6   Period Weeks   Status New   OT LONG TERM GOAL #2   Title Grip strength in L hand improve to about 50% compare to R hand to hold knife and carry groceries    Baseline grip NT    Time 6   Period Weeks   Status New               Plan - 05/16/15 1640    Clinical Impression Statement  Pt is 4 wks postop dupuytrens release - pt cont to wear splnt most all the time and tolering very well - no problems - pt  show great extention but MC flexion and composite flexion impaired - pt to focus on MC and composite flexion - increase fucntional ues - pt to cont with therapy - using it more  and starting walking again - pt to see MD tomorrow   Pt will benefit from skilled therapeutic intervention in order to improve on the following deficits (Retired) Impaired UE functional use;Pain;Decreased strength;Decreased scar mobility;Increased edema;Impaired flexibility;Decreased range of motion   Rehab Potential Good   OT Frequency 1x / week   OT Duration 6 weeks   OT Treatment/Interventions Contrast Bath;Fluidtherapy;Parrafin;Ultrasound;Therapeutic exercise;Patient/family education;Splinting;Manual Therapy;Scar mobilization;Passive range of motion   Plan assess flexion of digits and progress    Consulted and Agree with Plan of Care Patient        Problem List There are no active problems to display for this patient.   Rosalyn Gess  OTR/L,CLT 05/16/2015, 4:48 PM  [place] [facility] [address], [place], [address] Phone: [phone]  Fax:  [phone]  Name: [facility] Owens MRN: [id number] Date of Birth: 1940-12-26

## 2015-05-23 ENCOUNTER — Ambulatory Visit: Payer: PPO | Admitting: Occupational Therapy

## 2015-05-26 ENCOUNTER — Ambulatory Visit: Payer: PPO | Admitting: Occupational Therapy

## 2015-05-26 DIAGNOSIS — M25642 Stiffness of left hand, not elsewhere classified: Secondary | ICD-10-CM | POA: Diagnosis not present

## 2015-05-26 DIAGNOSIS — M6281 Muscle weakness (generalized): Secondary | ICD-10-CM

## 2015-05-26 DIAGNOSIS — L905 Scar conditions and fibrosis of skin: Secondary | ICD-10-CM

## 2015-05-26 NOTE — Patient Instructions (Signed)
See there ex flowsheet

## 2015-05-26 NOTE — Therapy (Signed)
[place] [facility] [address] [place], [address] Phone: [phone]   Fax:  [phone]  Occupational Therapy Treatment  Patient Details  Name: [facility] MRN: [id number] Date of Birth: 27-Jun-1940 Referring Provider: Leanor Kail  Encounter Date: 05/26/2015      OT End of Session - 05/26/15 1757    Visit Number 3   Number of Visits 12   Date for OT Re-Evaluation 06/22/15   OT Start Time 1425   OT Stop Time 1521   OT Time Calculation (min) 56 min   Activity Tolerance Patient tolerated treatment well   Behavior During Therapy [facility] for tasks assessed/performed      Past Medical History  Diagnosis Date  . Hyperlipidemia   . GERD (gastroesophageal reflux disease)   . COPD (chronic obstructive pulmonary disease) ([place])   . Bladder neck obstruction   . Shortness of breath dyspnea   . ADD (attention deficit disorder)   . Cancer ([place])     H/O ADENOMATOUS POLYP OF COLON  . Seasonal allergies     Past Surgical History  Procedure Laterality Date  . Adentamous colon polyp    . Left humerus debridement Left   . Appendectomy    . Tonsillectomy    . Colonoscopy with propofol N/A 01/28/2015    Procedure: COLONOSCOPY WITH PROPOFOL;  Surgeon: Manya Silvas, MD;  Location: [facility] ENDOSCOPY;  Service: Endoscopy;  Laterality: N/A;  . Esophagogastroduodenoscopy (egd) with propofol N/A 01/28/2015    Procedure: ESOPHAGOGASTRODUODENOSCOPY (EGD) WITH PROPOFOL;  Surgeon: Manya Silvas, MD;  Location: [facility] ENDOSCOPY;  Service: Endoscopy;  Laterality: N/A;  . Hernia repair Left     LEFT INGUINAL  . Trigger finger release Left 04/15/2015    Procedure: LEFT RING FINGER DUPUYTREN RELEASE;  Surgeon: Leanor Kail, MD;  Location: [place];  Service: Orthopedics;  Laterality: Left;    There were no vitals filed for this visit.  Visit Diagnosis:  Stiffness of finger joint of left hand  Scar condition and  fibrosis of skin  Muscle weakness      Subjective Assessment - 05/26/15 1442    Subjective  (p) NO pain at rest- but everything time trying to make fist or moving wrist -I feel strong pull in palm and wrist- then I stop - are you going to make my splint more straight - I seen Dr last week - my fingers get stiff duing day    Patient Stated Goals (p) Want to play pickle ball again , work in yard and around the house - anything that I need to make fist    Currently in Pain? (p) Yes   Pain Score (p) 5    Pain Location (p) Toe (Comment which one)   Pain Orientation (p) Left   Pain Descriptors / Indicators (p) Aching                      OT Treatments/Exercises (OP) - 05/26/15 0001    Wrist Exercises   Other wrist exercises Wrist extention stretches done and ed on - table slides, PROM , on edge of table 15 reps    Hand Exercises   Other Hand Exercises AAROM and PROM of MC flexion , AROM intrinsic fist , PROM composite fist each digits, AROM full fist to 2cm foram block , then 1 cm foam block ,   Other Hand Exercises ABD and ADD of digits with other hand inbetween , rolling  of teal putty for dgits extneion and over scar    LUE Contrast Bath   Time 11 minutes   Comments Hand open , then MC flexion , then flexion and AROM fist at start of 2nd and 3rd heat    Splinting   Splinting modified hand base dorsal extentio nsplint ofr 3rd and 4th to increase extention at PIP 's    Manual Therapy   Manual therapy comments Scar massage done and pt ed again - cut new cica scar pad for palmar scar for night time                 OT Education - 05/26/15 1756    Education provided Yes   Education Details HEP   Person(s) Educated Patient   Methods Explanation;Demonstration;Tactile cues;Verbal cues;Handout   Comprehension Verbal cues required;Returned demonstration;Verbalized understanding          OT Short Term Goals - 05/11/15 1742    OT SHORT TERM GOAL #1   Title Scar  healing improve for pt to tolerate all textures and massage to scar and use of tools with out increase pain    Baseline still some dry skin and scabs - tender    Time 4   Period Weeks   Status New   OT SHORT TERM GOAL #2   Title Pain on PRWHE improve with about 10 points   Baseline Pain on PRWHE 19/50   Time 3   Period Weeks   Status New           OT Long Term Goals - 05/11/15 1744    OT LONG TERM GOAL #1   Title AROM in all digits improve for pt to touch palm to make full fist to hold knife   Baseline MC 75 flexion 4th hand 5th ; PIP70 to 80 degrees    Time 6   Period Weeks   Status New   OT LONG TERM GOAL #2   Title Grip strength in L hand improve to about 50% compare to R hand to hold knife and carry groceries    Baseline grip NT    Time 6   Period Weeks   Status New               Plan - 05/26/15 1759    Clinical Impression Statement Pt is 6 wks postop dupuytrens Friday - pt to start weaning   out of daytime splint one hour more each day over the next week - but still wear at night time - did adjust splint - pt was more tight this date , more edema and  pain - but pt was not seen for abou 2wks - he cx last week - did see MD - pt should start showing increase AROM  now that daytime splint  is being wean    Pt will benefit from skilled therapeutic intervention in order to improve on the following deficits (Retired) Impaired UE functional use;Pain;Decreased strength;Decreased scar mobility;Increased edema;Impaired flexibility;Decreased range of motion   Rehab Potential Good   OT Frequency 1x / week   OT Duration 4 weeks   OT Treatment/Interventions Contrast Bath;Fluidtherapy;Parrafin;Ultrasound;Therapeutic exercise;Patient/family education;Splinting;Manual Therapy;Scar mobilization;Passive range of motion   Plan assess edema ,pain , ROM - focus on scar mobs    OT Home Exercise Plan see ther ex - same -    Consulted and Agree with Plan of Care Patient         Problem List There are no active problems to  display for this patient.   Rosalyn Gess OTR/L,CLT 05/26/2015, 6:02 PM  [place] [facility] [address], [place], [address] Phone: [phone]   Fax:  [phone]  Name: Arthur [facility] MRN: [id number] Date of Birth: Aug 30, 1940

## 2015-06-02 ENCOUNTER — Ambulatory Visit: Payer: PPO | Attending: Unknown Physician Specialty | Admitting: Occupational Therapy

## 2015-06-02 DIAGNOSIS — L905 Scar conditions and fibrosis of skin: Secondary | ICD-10-CM | POA: Insufficient documentation

## 2015-06-02 DIAGNOSIS — M25642 Stiffness of left hand, not elsewhere classified: Secondary | ICD-10-CM | POA: Insufficient documentation

## 2015-06-02 DIAGNOSIS — M6281 Muscle weakness (generalized): Secondary | ICD-10-CM | POA: Diagnosis not present

## 2015-06-02 NOTE — Patient Instructions (Addendum)
Same as last time - isotoner glove at night time and as needed in during day  Splint at night time   FLexion and extention exercises the same  ADD and ADD of digits  Scar massage   Add light blue putty for grip , lat and 3 point grip - and alternate digits for pinching

## 2015-06-02 NOTE — Therapy (Signed)
[place] [facility] [address], [place], [address] Phone: [phone]   Fax:  [phone]  Occupational Therapy Treatment  Patient Details  Name: Arthur Owens MRN: [id number] Date of Birth: 01/05/1941 Referring Provider: Leanor Kail  Encounter Date: 06/02/2015      OT End of Session - 06/02/15 1409    Visit Number 4   Number of Visits 12   Date for OT Re-Evaluation 06/22/15   OT Start Time 1355   OT Stop Time 1452   OT Time Calculation (min) 57 min   Activity Tolerance Patient tolerated treatment well   Behavior During Therapy [facility] for tasks assessed/performed      Past Medical History  Diagnosis Date  . Hyperlipidemia   . GERD (gastroesophageal reflux disease)   . COPD (chronic obstructive pulmonary disease) ([place])   . Bladder neck obstruction   . Shortness of breath dyspnea   . ADD (attention deficit disorder)   . Cancer ([place])     H/O ADENOMATOUS POLYP OF COLON  . Seasonal allergies     Past Surgical History  Procedure Laterality Date  . Adentamous colon polyp    . Left humerus debridement Left   . Appendectomy    . Tonsillectomy    . Colonoscopy with propofol N/A 01/28/2015    Procedure: COLONOSCOPY WITH PROPOFOL;  Surgeon: Manya Silvas, MD;  [facility] END[facility]COPY;  Service: Endoscopy;  Laterality: N/A;  . Esophagogastroduodenoscopy (egd) with propofol N/A 01/28/2015    Procedure: ESOPHAGOGASTRODUODENOSCOPY (EGD) WITH PROPOFOL;  Surgeon: Manya Silvas, MD;  Location: [facility] ENDOSCOPY;  Service: Endoscopy;  Laterality: N/A;  . Hernia repair Left     LEFT INGUINAL  . Trigger finger release Left 04/15/2015    Procedure: LEFT RING FINGER DUPUYTREN RELEASE;  Surgeon: Leanor Kail, MD;  Location: [place];  Service: Orthopedics;  Laterality: Left;    There were no vitals filed for this visit.  Visit Diagnosis:  Stiffness of finger joint of left hand  Scar condition and  fibrosis of skin  Muscle weakness      Subjective Assessment - 06/02/15 1403    Subjective  I am only sleeping with my splint but it stays stiff , tight when  I am trying to make fist- I think opening it is great - splint at night time still - but swelling still over the knuckles    Patient Stated Goals Want to play pickle ball again , work in yard and around the house - anything that I need to make fist    Currently in Pain? No/denies            [facility] OT Assessment - 06/02/15 0001    Strength   Right Hand Grip (lbs) 66   Right Hand Lateral Pinch 22 lbs   Right Hand 3 Point Pinch 11 lbs   Left Hand Grip (lbs) 9   Left Hand Lateral Pinch 10 lbs   Left Hand 3 Point Pinch 4 lbs                  OT Treatments/Exercises (OP) - 06/02/15 0001    Wrist Exercises   Other wrist exercises Wrist extention slides on wall and table - less pain and more ROM    Hand Exercises   Other Hand Exercises PROM for DIP and PIP of all digits ; AAROM and PROM of MC flexion , AROM intrinsic fist , PROM composite fist each digits, AROM full  fist to 2cm foram block , then 1 cm foam block ,   Other Hand Exercises interlock digits with ABD - able to do fully after fludio and manual - teal putty for rolling - add light blue putty for grip , lat and 3 point grip - can alternate digits too    LUE Fluidotherapy   Number Minutes Fluidotherapy 10 Minutes   LUE Fluidotherapy Location Hand;Wrist   Comments AROM at [facility] to decrease stiffness in L hand prior tp ROM and manual therapy    Manual Therapy   Manual therapy comments Cont with cica scar pad at night itme - fitted with isotoner gllove for night time and as needed during day - soft tissue mobs at palm with Graston tool 2 and 4 to decrease tightness -  using sweepig, scooping, brushing  tech at 30-60 degrees angle- stayed off scar but did thenar eminence and hypo thenar , and palmar 2nd and 3rd digit  - pt had more ROM , increase flexibilty to lay palm  flat                 OT Education - 06/02/15 1623    Education Details see pt instruction           OT Short Term Goals - 06/02/15 1409    OT SHORT TERM GOAL #1   Title Scar healing improve for pt to tolerate all textures and massage to scar and use of tools with out increase pain    Baseline still tender -tight with gripping    Time 3   Period Weeks   Status On-going   OT SHORT TERM GOAL #2   Title Pain on PRWHE improve with about 10 points   Baseline still pain with tight fist    Time 3   Period Weeks   Status On-going           OT Long Term Goals - 06/02/15 1410    OT LONG TERM GOAL #1   Title AROM in all digits improve for pt to touch palm to make full fist to hold knife   Baseline MC 75 flexion 4th hand 5th ; PIP70 to 80 degrees    Time 4   Period Weeks   Status On-going   OT LONG TERM GOAL #2   Title Grip strength in L hand improve to about 50% compare to R hand to hold knife and carry groceries    Baseline intiated grip with putty this date    Time 4   Period Weeks   Status On-going               Plan - 06/02/15 1624    Clinical Impression Statement Pt showed increase ROM in digts extention and  then in flexion during session with fluido and manual therapy using Graston tools - pt still edema over MC's and palm - fitted with isotoner glove this date - and grip decrease compare to L - initated gentle gripping with easy putty    Pt will benefit from skilled therapeutic intervention in order to improve on the following deficits (Retired) Impaired UE functional use;Pain;Decreased strength;Decreased scar mobility;Increased edema;Impaired flexibility;Decreased range of motion   Rehab Potential Good   OT Frequency 1x / week   OT Duration 6 weeks   OT Treatment/Interventions Contrast Bath;Fluidtherapy;Parrafin;Ultrasound;Therapeutic exercise;Patient/family education;Splinting;Manual Therapy;Scar mobilization;Passive range of motion   Plan assess if  edema decrease , stiffness and pain    OT Home Exercise Plan see ther ex -  same -    Consulted and Agree with Plan of Care Patient        Problem List There are no active problems to display for this patient.   Rosalyn Gess OTR/L,CLT 06/02/2015, 4:27 PM  [place] [facility] [address] [place], [address] Phone: [phone]   Fax:  [phone]  Name: Arthur Owens MRN: [id number] Date of Birth: [birth year]

## 2015-06-08 ENCOUNTER — Ambulatory Visit: Payer: PPO | Admitting: Occupational Therapy

## 2015-06-08 DIAGNOSIS — L905 Scar conditions and fibrosis of skin: Secondary | ICD-10-CM

## 2015-06-08 DIAGNOSIS — M25642 Stiffness of left hand, not elsewhere classified: Secondary | ICD-10-CM | POA: Diagnosis not present

## 2015-06-08 DIAGNOSIS — M6281 Muscle weakness (generalized): Secondary | ICD-10-CM

## 2015-06-08 NOTE — Therapy (Signed)
[place] [facility] [address], [place], [address] Phone: [phone]   Fax:  [phone]  Occupational Therapy Treatment  Patient Details  Name: Arthur Owens MRN: [id number] Date of Birth: [birth year] Referring Provider: Leanor Kail  Encounter Date: 06/08/2015      OT End of Session - 06/08/15 1442    Visit Number 5   Number of Visits 12   Date for OT Re-Evaluation 06/22/15   OT Start Time 1218   OT Stop Time 1315   OT Time Calculation (min) 57 min   Activity Tolerance Patient tolerated treatment well   Behavior During Therapy [facility] for tasks assessed/performed      Past Medical History  Diagnosis Date  . Hyperlipidemia   . GERD (gastroesophageal reflux disease)   . COPD (chronic obstructive pulmonary disease) ([place])   . Bladder neck obstruction   . Shortness of breath dyspnea   . ADD (attention deficit disorder)   . Cancer ([place])     H/O ADENOMATOUS POLYP OF COLON  . Seasonal allergies     Past Surgical History  Procedure Laterality Date  . Adentamous colon polyp    . Left humerus debridement Left   . Appendectomy    . Tonsillectomy    . Colonoscopy with propofol N/A 01/28/2015    Procedure: COLONOSCOPY WITH PROPOFOL;  Surgeon: Manya Silvas, MD;  Location: [facility] ENDOSCOPY;  Service: Endoscopy;  Laterality: N/A;  . Esophagogastroduodenoscopy (egd) with propofol N/A 01/28/2015    Procedure: ESOPHAGOGASTRODUODENOSCOPY (EGD) WITH PROPOFOL;  Surgeon: Manya Silvas, MD;  Location: [facility] ENDOSCOPY;  Service: Endoscopy;  Laterality: N/A;  . Hernia repair Left     LEFT INGUINAL  . Trigger finger release Left 04/15/2015    Procedure: LEFT RING FINGER DUPUYTREN RELEASE;  Surgeon: Leanor Kail, MD;  Location: [place];  Service: Orthopedics;  Laterality: Left;    There were no vitals filed for this visit.  Visit Diagnosis:  Stiffness of finger joint of left hand  Scar condition and  fibrosis of skin  Muscle weakness      Subjective Assessment - 06/08/15 1438    Subjective  My palm still hurting when grasping things and swollen - but I used it do bath, do laundery , some cooking , open cabinet doors - but I work it all the time when I sitting down - my fingers and my scar   Patient Stated Goals Want to play pickle ball again , work in yard and around the house - anything that I need to make fist    Currently in Pain? Yes   Pain Score 2    Pain Location Hand   Pain Orientation Left   Pain Descriptors / Indicators Aching                      OT Treatments/Exercises (OP) - 06/08/15 0001    Wrist Exercises   Other wrist exercises Wrist extention strethc 10 x    Hand Exercises   Other Hand Exercises PROM compostie fist after AROM to 3cm , to 2 cm to 1 cm ojbect out of palm , blockefd AROM PIP , MC    Other Hand Exercises PROM composter fist - place and hold - follow by BTE for grip 10 lbs 2 x 120 sec    LUE Fluidotherapy   Number Minutes Fluidotherapy 10 Minutes   LUE Fluidotherapy Location Hand;Wrist   Comments AROM for wrist nad digits  at [facility] to increase ROM and decrease pain                 OT Education - 06/08/15 1442    Education provided Yes   Education Details See pt instruction    Person(s) Educated Patient   Methods Explanation;Demonstration;Tactile cues;Verbal cues   Comprehension Verbal cues required;Returned demonstration;Verbalized understanding          OT Short Term Goals - 06/02/15 1409    OT SHORT TERM GOAL #1   Title Scar healing improve for pt to tolerate all textures and massage to scar and use of tools with out increase pain    Baseline still tender -tight with gripping    Time 3   Period Weeks   Status On-going   OT SHORT TERM GOAL #2   Title Pain on PRWHE improve with about 10 points   Baseline still pain with tight fist    Time 3   Period Weeks   Status On-going           OT Long Term Goals -  06/02/15 1410    OT LONG TERM GOAL #1   Title AROM in all digits improve for pt to touch palm to make full fist to hold knife   Baseline MC 75 flexion 4th hand 5th ; PIP70 to 80 degrees    Time 4   Period Weeks   Status On-going   OT LONG TERM GOAL #2   Title Grip strength in L hand improve to about 50% compare to R hand to hold knife and carry groceries    Baseline intiated grip with putty this date    Time 4   Period Weeks   Status On-going               Plan - 06/08/15 1443    Clinical Impression Statement Pt cont to had swelling over MC's and palm - and tenderness - talked with pt and found he is constantly moving fingers with other hand and do scar mssage- pt to only do 2-3 x day and use it more -was able to do some gripping on BTE with out pain over scar    Pt will benefit from skilled therapeutic intervention in order to improve on the following deficits (Retired) Impaired UE functional use;Pain;Decreased strength;Decreased scar mobility;Increased edema;Impaired flexibility;Decreased range of motion   Rehab Potential Good   OT Frequency 1x / week   OT Duration 4 weeks   OT Treatment/Interventions Contrast Bath;Fluidtherapy;Parrafin;Ultrasound;Therapeutic exercise;Patient/family education;Splinting;Manual Therapy;Scar mobilization;Passive range of motion   Plan assesse pain and edema    OT Home Exercise Plan see ther ex - same -    Consulted and Agree with Plan of Care Patient        Problem List There are no active problems to display for this patient.   Rosalyn Gess OTR/L,CLT 06/08/2015, 2:45 PM  [place] [facility] [address], [place], [address] Phone: [phone]   Fax:  [phone]  Name: Arthur Owens MRN: [id number] Date of Birth: [birth year]

## 2015-06-08 NOTE — Patient Instructions (Signed)
Same but only 2-3 x day  Pt was working with fingers all the time and rubbing scar

## 2015-06-15 ENCOUNTER — Ambulatory Visit: Payer: PPO | Admitting: Occupational Therapy

## 2015-06-15 DIAGNOSIS — M25642 Stiffness of left hand, not elsewhere classified: Secondary | ICD-10-CM

## 2015-06-15 DIAGNOSIS — L905 Scar conditions and fibrosis of skin: Secondary | ICD-10-CM

## 2015-06-15 DIAGNOSIS — M6281 Muscle weakness (generalized): Secondary | ICD-10-CM

## 2015-06-15 NOTE — Patient Instructions (Signed)
Pt reed on scar mobs  Then PROM and AROM - was doing intrinsic fist wrong  And then light blue putty do 3-4 reps - with place nad hold and move to AROM - and larger to small ball With exention stretch in between

## 2015-06-15 NOTE — Therapy (Signed)
[place] [facility] [address] [place], [address] Phone: [phone]   Fax:  [phone]  Occupational Therapy Treatment  Patient Details  Name: Arthur Owens MRN: [id number] Date of Birth: 03/22/1941 Referring Provider: Leanor Kail  Encounter Date: 06/15/2015      OT End of Session - 06/15/15 1408    Visit Number 6   Number of Visits 12   Date for OT Re-Evaluation 06/22/15   OT Start Time 1236   OT Stop Time 1330   OT Time Calculation (min) 54 min   Activity Tolerance Patient tolerated treatment well   Behavior During Therapy [facility] for tasks assessed/performed      Past Medical History  Diagnosis Date  . Hyperlipidemia   . GERD (gastroesophageal reflux disease)   . COPD (chronic obstructive pulmonary disease) ([place])   . Bladder neck obstruction   . Shortness of breath dyspnea   . ADD (attention deficit disorder)   . Cancer ([place])     H/O ADENOMATOUS POLYP OF COLON  . Seasonal allergies     Past Surgical History  Procedure Laterality Date  . Adentamous colon polyp    . Left humerus debridement Left   . Appendectomy    . Tonsillectomy    . Colonoscopy with propofol N/A 01/28/2015    Procedure: COLONOSCOPY WITH PROPOFOL;  Surgeon: Manya Silvas, MD;  Location: [facility] ENDOSCOPY;  Service: Endoscopy;  Laterality: N/A;  . Esophagogastroduodenoscopy (egd) with propofol N/A 01/28/2015    Procedure: ESOPHAGOGASTRODUODENOSCOPY (EGD) WITH PROPOFOL;  Surgeon: Manya Silvas, MD;  Location: [facility] ENDOSCOPY;  Service: Endoscopy;  Laterality: N/A;  . Hernia repair Left     LEFT INGUINAL  . Trigger finger release Left 04/15/2015    Procedure: LEFT RING FINGER DUPUYTREN RELEASE;  Surgeon: Leanor Kail, MD;  Location: [place];  Service: Orthopedics;  Laterality: Left;    There were no vitals filed for this visit.  Visit Diagnosis:  Stiffness of finger joint of left hand  Scar condition and  fibrosis of skin  Muscle weakness      Subjective Assessment - 06/15/15 1257    Subjective  The swelling - I am telling you - I cannot bend it - no pain just tight like leatehr - cannot make fist - and I did like you told me - and only worked with it 3 x day to exerices - not moving them constant   Patient Stated Goals Want to play pickle ball again , work in yard and around the house - anything that I need to make fist    Currently in Pain? Yes   Pain Score 1    Pain Location Hand   Pain Orientation Left                      OT Treatments/Exercises (OP) - 06/15/15 0001    Wrist Exercises   Other wrist exercises Wrist and finger extention stretches inbetween gripping with putty    Hand Exercises   Other Hand Exercises PROM compostite fist aftet blocked AROM MC flexion and intrinsic fist - pt report he was doing intrinsioc fist wrong    Other Hand Exercises Light blue putty for gripping ball place and hold , then smaller , to 1 cm out of palm - with AROM into putty - with composite extention strethc every 3-4 reps    LUE Contrast Bath   Time 11 minutes   Comments Open hand ,  then into fist last 2 cycle of heat to increase fisting    Manual Therapy   Manual therapy comments Focus on scar mobs this date - in palm at corner, [facility] and PIP - circle, criss cross and back and forth - pt report he was not doing that - just rubbing over it                 OT Education - 06/15/15 1405    Education provided Yes   Education Details See pt instruction    Person(s) Educated Patient   Methods Explanation;Demonstration;Tactile cues;Verbal cues   Comprehension Verbalized understanding;Returned demonstration;Verbal cues required;Tactile cues required          OT Short Term Goals - 06/02/15 1409    OT SHORT TERM GOAL #1   Title Scar healing improve for pt to tolerate all textures and massage to scar and use of tools with out increase pain    Baseline still tender -tight  with gripping    Time 3   Period Weeks   Status On-going   OT SHORT TERM GOAL #2   Title Pain on PRWHE improve with about 10 points   Baseline still pain with tight fist    Time 3   Period Weeks   Status On-going           OT Long Term Goals - 06/02/15 1410    OT LONG TERM GOAL #1   Title AROM in all digits improve for pt to touch palm to make full fist to hold knife   Baseline MC 75 flexion 4th hand 5th ; PIP70 to 80 degrees    Time 4   Period Weeks   Status On-going   OT LONG TERM GOAL #2   Title Grip strength in L hand improve to about 50% compare to R hand to hold knife and carry groceries    Baseline intiated grip with putty this date    Time 4   Period Weeks   Status On-going               Plan - 06/15/15 1409    Clinical Impression Statement Pt cont to have swelling over [facility]' s- pt was reed on scar mobs this date because of one scar looked worse in palm - then after contrast and ROM - pt was able to get to one finger out of palm - with less pain - but pt was not doing intrinsic fist that will help with composite fist - will phone MD in am  - pt appt tommorrow - to see if can give something for edema    Pt will benefit from skilled therapeutic intervention in order to improve on the following deficits (Retired) Impaired UE functional use;Pain;Decreased strength;Decreased scar mobility;Increased edema;Impaired flexibility;Decreased range of motion   Rehab Potential Good   OT Frequency 1x / week   OT Duration 4 weeks   OT Treatment/Interventions Contrast Bath;Fluidtherapy;Parrafin;Ultrasound;Therapeutic exercise;Patient/family education;Splinting;Manual Therapy;Scar mobilization;Passive range of motion   Plan  how MD appt went - edema /ROM ?   OT Home Exercise Plan see ther ex - same -    Consulted and Agree with Plan of Care Patient        Problem List There are no active problems to display for this patient.    Rosalyn Gess OTR/L,CLT  06/15/2015,  2:13 PM  [place] [facility] [address] [place], [address] Phone: [phone]   Fax:  [phone]  Name: Arthur Owens MRN: [id number] Date of Birth: 1940/07/27

## 2015-06-23 ENCOUNTER — Ambulatory Visit: Payer: PPO | Admitting: Occupational Therapy

## 2015-06-23 DIAGNOSIS — M25642 Stiffness of left hand, not elsewhere classified: Secondary | ICD-10-CM

## 2015-06-23 DIAGNOSIS — M6281 Muscle weakness (generalized): Secondary | ICD-10-CM

## 2015-06-23 DIAGNOSIS — L905 Scar conditions and fibrosis of skin: Secondary | ICD-10-CM

## 2015-06-23 NOTE — Therapy (Signed)
[place] [facility] [address] [place], [address] Phone: [phone]   Fax:  [phone]  Occupational Therapy Treatment  Patient Details  Name: Arthur Owens MRN: [id number] Date of Birth: Feb 04, 1941 Referring Provider: Leanor Kail  Encounter Date: 06/23/2015      OT End of Session - 06/23/15 1222    Visit Number 7   Number of Visits 12   Date for OT Re-Evaluation 07/07/15   OT Start Time 1143   OT Stop Time 1222   OT Time Calculation (min) 39 min   Activity Tolerance Patient tolerated treatment well   Behavior During Therapy [facility] for tasks assessed/performed      Past Medical History  Diagnosis Date  . Hyperlipidemia   . GERD (gastroesophageal reflux disease)   . COPD (chronic obstructive pulmonary disease) ([place])   . Bladder neck obstruction   . Shortness of breath dyspnea   . ADD (attention deficit disorder)   . Cancer ([place])     H/O ADENOMATOUS POLYP OF COLON  . Seasonal allergies     Past Surgical History  Procedure Laterality Date  . Adentamous colon polyp    . Left humerus debridement Left   . Appendectomy    . Tonsillectomy    . Colonoscopy with propofol N/A 01/28/2015    Procedure: COLONOSCOPY WITH PROPOFOL;  Surgeon: Manya Silvas, MD;  Location: [facility] ENDOSCOPY;  Service: Endoscopy;  Laterality: N/A;  . Esophagogastroduodenoscopy (egd) with propofol N/A 01/28/2015    Procedure: ESOPHAGOGASTRODUODENOSCOPY (EGD) WITH PROPOFOL;  Surgeon: Manya Silvas, MD;  Location: [facility] ENDOSCOPY;  Service: Endoscopy;  Laterality: N/A;  . Hernia repair Left     LEFT INGUINAL  . Trigger finger release Left 04/15/2015    Procedure: LEFT RING FINGER DUPUYTREN RELEASE;  Surgeon: Leanor Kail, MD;  Location: [place];  Service: Orthopedics;  Laterality: Left;    There were no vitals filed for this visit.  Visit Diagnosis:  Stiffness of finger joint of left hand - Plan: Ot plan of care  cert/re-cert  Scar condition and fibrosis of skin - Plan: Ot plan of care cert/re-cert  Muscle weakness - Plan: Ot plan of care cert/re-cert      Subjective Assessment - 06/23/15 1152    Subjective  Seen DR Jefm Bryant - put me on Predisone - 4 days over - stiffness still there but bending more - swelling so much better -I am using it more cooking, laundry , bathing- making bed , can clap hands   Patient Stated Goals Want to play pickle ball again , work in yard and around the house - anything that I need to make fist    Currently in Pain? No/denies            [facility] OT Assessment - 06/23/15 0001    Strength   Right Hand Grip (lbs) 66   Right Hand Lateral Pinch 22 lbs   Right Hand 3 Point Pinch 11 lbs   Left Hand Grip (lbs) 40   Left Hand Lateral Pinch 16 lbs   Left Hand 3 Point Pinch 9 lbs                  OT Treatments/Exercises (OP) - 06/23/15 0001    ADLs   ADL Comments built up handles provided for broom and rake t0 use compositer fist    Wrist Exercises   Other wrist exercises Wrist and finger extention stretches inbetween gripping with putty  Hand Exercises   Other Hand Exercises Composite fist PROM , place and hold, teal putty gripping ball , end range into cylincer , lat grip , 3 point grip , pullling with all digits, twisting all digits    Other Hand Exercises excellent extention - flat hand on table    LUE Fluidotherapy   Number Minutes Fluidotherapy 12 Minutes   LUE Fluidotherapy Location Hand;Wrist   Comments AROM at [facility] to increase ROM  in digits flexion and wrsit                 OT Education - 06/23/15 1222    Education provided Yes   Education Details see pt instruction           OT Short Term Goals - 06/23/15 1224    OT SHORT TERM GOAL #1   Title Scar healing improve for pt to tolerate all textures and massage to scar and use of tools with out increase pain    OT SHORT TERM GOAL #2   Title Pain on PRWHE improve with about 10  points   Baseline no pain more sitffness   Status Achieved           OT Long Term Goals - 06/23/15 1225    OT LONG TERM GOAL #1   Title AROM in all digits improve for pt to touch palm to make full fist to hold knife   Baseline still 1 cm from palm - but end of session able to place and hold touching palm    Time 2   Period Weeks   Status On-going   OT LONG TERM GOAL #2   Title Grip strength in L hand improve to about 50% compare to R hand to hold knife and carry groceries    Baseline see flowsheet - improved    Time 2   Period Weeks   Status On-going               Plan - 06/23/15 1223    Clinical Impression Statement Pt swelling improved greatly and ROM - on predisone since MD appt and 4 more days left - pt to cotn with  ROM , strength with teal putty  and functional use - will see him in 2 wks    Pt will benefit from skilled therapeutic intervention in order to improve on the following deficits (Retired) Impaired UE functional use;Pain;Decreased strength;Decreased scar mobility;Increased edema;Impaired flexibility;Decreased range of motion   Rehab Potential Good   OT Frequency Biweekly   OT Duration 2 weeks   OT Treatment/Interventions Contrast Bath;Fluidtherapy;Parrafin;Ultrasound;Therapeutic exercise;Patient/family education;Splinting;Manual Therapy;Scar mobilization;Passive range of motion   Plan follow up in 2 wks - assess progress   OT Home Exercise Plan see ther ex - same -    Consulted and Agree with Plan of Care Patient        Problem List There are no active problems to display for this patient.   Rosalyn Gess OTR/L,CLT  06/23/2015, 12:28 PM  [place] [facility] [address] [place], [address] Phone: [phone]   Fax:  [phone]  Name: Arthur Owens MRN: [id number] Date of Birth: June 21, 1940

## 2015-06-23 NOTE — Patient Instructions (Addendum)
Tendon glides  Teal putty for grip , end range grip , lat and 3 point grip  Pulling with all digits, twisting  12 reps  2-3  X day   Built up handles to use on rake and broom  functional use

## 2015-07-07 ENCOUNTER — Ambulatory Visit: Payer: PPO | Attending: Unknown Physician Specialty | Admitting: Occupational Therapy

## 2015-07-07 DIAGNOSIS — L905 Scar conditions and fibrosis of skin: Secondary | ICD-10-CM | POA: Diagnosis not present

## 2015-07-07 DIAGNOSIS — M6281 Muscle weakness (generalized): Secondary | ICD-10-CM | POA: Insufficient documentation

## 2015-07-07 DIAGNOSIS — M25642 Stiffness of left hand, not elsewhere classified: Secondary | ICD-10-CM | POA: Diagnosis not present

## 2015-07-07 NOTE — Patient Instructions (Signed)
Same  putty and add pulling with ulnar side of hand - 4th and 5th digit 2 x 12 -15 reps   Table slides for extention stretch and scar

## 2015-07-07 NOTE — Therapy (Signed)
[facility] [address], [place], [address] Phone: [phone]   Fax:  [phone]  Occupational Therapy Treatment  Patient Details  Name: Arthur Owens MRN: [id number] Date of Birth: Oct 18, 1940 Referring Provider: Leanor Kail  Encounter Date: 07/07/2015      OT End of Session - 07/07/15 1324    Visit Number 8   Number of Visits 8   Date for OT Re-Evaluation 07/07/15   OT Start Time 1134   OT Stop Time 1207   OT Time Calculation (min) 33 min   Activity Tolerance Patient tolerated treatment well   Behavior During Therapy [facility] for tasks assessed/performed      Past Medical History  Diagnosis Date  . Hyperlipidemia   . GERD (gastroesophageal reflux disease)   . COPD (chronic obstructive pulmonary disease) ([place])   . Bladder neck obstruction   . Shortness of breath dyspnea   . ADD (attention deficit disorder)   . Cancer ([place])     H/O ADENOMATOUS POLYP OF COLON  . Seasonal allergies     Past Surgical History  Procedure Laterality Date  . Adentamous colon polyp    . Left humerus debridement Left   . Appendectomy    . Tonsillectomy    . Colonoscopy with propofol N/A 01/28/2015    Procedure: COLONOSCOPY WITH PROPOFOL;  Surgeon: Manya Silvas, MD;  Location: [facility] ENDOSCOPY;  Service: Endoscopy;  Laterality: N/A;  . Esophagogastroduodenoscopy (egd) with propofol N/A 01/28/2015    Procedure: ESOPHAGOGASTRODUODENOSCOPY (EGD) WITH PROPOFOL;  Surgeon: Manya Silvas, MD;  Location: [facility] ENDOSCOPY;  Service: Endoscopy;  Laterality: N/A;  . Hernia repair Left     LEFT INGUINAL  . Trigger finger release Left 04/15/2015    Procedure: LEFT RING FINGER DUPUYTREN RELEASE;  Surgeon: Leanor Kail, MD;  Location: [place];  Service: Orthopedics;  Laterality: Left;    There were no vitals filed for this visit.  Visit Diagnosis:  Stiffness of finger joint of left hand  Scar condition and  fibrosis of skin  Muscle weakness      Subjective Assessment - 07/07/15 1134    Subjective  Using it more - finished predisone and but on that otther meds now - but I dont' like the side effects- useing it more - but cannot make tight fist or like holding fork    Patient Stated Goals Want to play pickle ball again , work in yard and around the house - anything that I need to make fist    Currently in Pain? No/denies            [facility] OT Assessment - 07/07/15 0001    Strength   Right Hand Grip (lbs) (p) 66   Right Hand Lateral Pinch (p) 22 lbs   Right Hand 3 Point Pinch (p) 11 lbs   Left Hand Grip (lbs) (p) 31   Left Hand Lateral Pinch (p) 16 lbs   Left Hand 3 Point Pinch (p) 10 lbs   Left Hand AROM   L Index  MCP 0-90 (p) 80 Degrees   L Index PIP 0-100 (p) 90 Degrees   L Long  MCP 0-90 (p) 80 Degrees   L Long PIP 0-100 (p) 90 Degrees   L Ring  MCP 0-90 (p) 80 Degrees   L Ring PIP 0-100 (p) 90 Degrees   L Little PIP 0-100 (p) 80 Degrees  OT Treatments/Exercises (OP) - 07/07/15 0001    ADLs   ADL Comments Review PRWHE  function and pain    Hand Exercises   Other Hand Exercises Measured grip and DIgits rom    Other Hand Exercises $th and 5th  toching palm after fiting AROM 12 reps - add green putty endrange ,ball , lat and 3 point - add pulling  with ulnar side of hand  - to increase flexion and grip in 4th and 5th digits    Manual Therapy   Manual therapy comments Pt able to tolerate different textures - still tender hard opbjects - and ed on cont composite digits and wrist  - for scar  2 x day                  OT Education - 07/07/15 1320    Education provided Yes   Education Details HEP and Development worker, international aid) Educated Patient   Methods Demonstration;Tactile cues;Explanation;Verbal cues;Handout   Comprehension Returned demonstration;Verbal cues required;Verbalized understanding          OT Short Term Goals -  07/07/15 1452    OT SHORT TERM GOAL #1   Title Scar healing improve for pt to tolerate all textures and massage to scar and use of tools with out increase pain    Status Achieved   OT SHORT TERM GOAL #2   Title Pain on PRWHE improve with about 10 points   Baseline pain 0/50   Status Achieved           OT Long Term Goals - 07/07/15 1453    OT LONG TERM GOAL #1   Title AROM in all digits improve for pt to touch palm to make full fist to hold knife   Baseline touching palm - still not strength in 4th and 5th digit to hold fork tight but can cut with ease   Status Achieved   OT LONG TERM GOAL #2   Title Grip strength in L hand improve to about 50% compare to R hand to hold knife and carry groceries    Baseline Grip on L 35 LBS, R  66lbs   Status Partially Met               Plan - 07/07/15 1332    Clinical Impression Statement Pt made great progress in ROM in L hand ,i functional use , edema and pain - pt still limited in grip strength but as 4th and 5th digit flexion improve as well as strenght grip will improve. Pt met all goals and independent in HEP and will be discharge from OT services  if not heaing from him by end of month   Pt will benefit from skilled therapeutic intervention in order to improve on the following deficits (Retired) Impaired UE functional use;Pain;Decreased strength;Decreased scar mobility;Increased edema;Impaired flexibility;Decreased range of motion   Rehab Potential Good   OT Treatment/Interventions Contrast Bath;Fluidtherapy;Parrafin;Ultrasound;Therapeutic exercise;Patient/family education;Splinting;Manual Therapy;Scar mobilization;Passive range of motion   Plan recommned discharge with HEP    OT Home Exercise Plan see pt instruction    Consulted and Agree with Plan of Care Patient        Problem List There are no active problems to display for this patient.   Rosalyn Gess OTR/L,CLT 07/07/2015, 2:55 PM  [facility] [address] [place], [address] Phone: [phone]   Fax:  [phone]  Name: Seneca Hoback MRN: [id number] Date of  Birth: [birth year]

## 2015-08-25 DIAGNOSIS — J439 Emphysema, unspecified: Secondary | ICD-10-CM | POA: Diagnosis not present

## 2015-09-06 DIAGNOSIS — E7800 Pure hypercholesterolemia, unspecified: Secondary | ICD-10-CM | POA: Diagnosis not present

## 2015-09-06 DIAGNOSIS — I10 Essential (primary) hypertension: Secondary | ICD-10-CM | POA: Diagnosis not present

## 2015-09-12 DIAGNOSIS — K210 Gastro-esophageal reflux disease with esophagitis: Secondary | ICD-10-CM | POA: Diagnosis not present

## 2015-09-12 DIAGNOSIS — I10 Essential (primary) hypertension: Secondary | ICD-10-CM | POA: Diagnosis not present

## 2015-09-12 DIAGNOSIS — E7800 Pure hypercholesterolemia, unspecified: Secondary | ICD-10-CM | POA: Diagnosis not present

## 2015-09-12 DIAGNOSIS — Z23 Encounter for immunization: Secondary | ICD-10-CM | POA: Diagnosis not present

## 2015-09-13 ENCOUNTER — Encounter: Payer: PPO | Attending: Specialist | Admitting: Respiratory Therapy

## 2015-09-13 VITALS — Ht 68.5 in | Wt 183.2 lb

## 2015-09-13 DIAGNOSIS — J449 Chronic obstructive pulmonary disease, unspecified: Secondary | ICD-10-CM | POA: Diagnosis not present

## 2015-09-13 NOTE — Progress Notes (Signed)
Pulmonary Individual Treatment Plan  Patient Details  Name: Arthur Owens MRN: [id number] Date of Birth: 1940/08/11 Referring Provider: Dr Wallene Huh   Initial Encounter Date:       Pulmonary Rehab from 09/13/2015 in [facility] Cardiac and Pulmonary Rehab   Date  09/13/15      Visit Diagnosis: COPD, mild ([facility])  Patient's Home Medications on Admission:  Current outpatient prescriptions:    albuterol (PROVENTIL HFA;VENTOLIN HFA) 108 (90 BASE) MCG/ACT inhaler, Inhale 2 puffs into the lungs every 6 (six) hours as needed for wheezing or shortness of breath., Disp: , Rfl:    atorvastatin (LIPITOR) 40 MG tablet, Take 40 mg by mouth daily. PM, Disp: , Rfl:    cetirizine (ZYRTEC) 10 MG tablet, Take 10 mg by mouth daily. AM, Disp: , Rfl:    fluticasone (FLONASE) 50 MCG/ACT nasal spray, Place 2 sprays into both nostrils as needed. , Disp: , Rfl:    hydrochlorothiazide (HYDRODIURIL) 12.5 MG tablet, Take 12.5 mg by mouth daily. AM, Disp: , Rfl:    methylphenidate (METADATE CD) 20 MG CR capsule, Take 20 mg by mouth every morning., Disp: , Rfl:    omeprazole (PRILOSEC) 20 MG capsule, Take 20 mg by mouth 2 (two) times daily before a meal. , Disp: , Rfl:    tamsulosin (FLOMAX) 0.4 MG CAPS capsule, Take 0.4 mg by mouth. EVE, Disp: , Rfl:    tiotropium (SPIRIVA) 18 MCG inhalation capsule, Place 18 mcg into inhaler and inhale daily. AM, Disp: , Rfl:   Past Medical History: Past Medical History  Diagnosis Date   Hyperlipidemia    GERD (gastroesophageal reflux disease)    COPD (chronic obstructive pulmonary disease) (HCC)    Bladder neck obstruction    Shortness of breath dyspnea    ADD (attention deficit disorder)    Cancer (HCC)     H/O ADENOMATOUS POLYP OF COLON   Seasonal allergies     Tobacco Use: History  Smoking status   Former Smoker -- 2.00 packs/day for 15 years   Types: Cigarettes  Smokeless tobacco   Never Used    Labs: Recent Review Flowsheet Data    There  is no flowsheet data to display.       ADL [facility]:     Pulmonary Assessment Scores      09/13/15 0900       ADL [facility]   ADL Phase Entry     SOB Score total 56     Rest 0     Walk 3     Stairs 3     Bath 3     Dress 3     Shop 2        Pulmonary Function Assessment:     Pulmonary Function Assessment - 09/13/15 0900    Pulmonary Function Tests   RV% 85 %   DLCO% 101 %   Initial Spirometry Results   FVC% 116 %   FEV1% 102 %   FEV1/FVC Ratio 69   Comments PFT 01/06/15   Breath   Bilateral Breath Sounds Clear   Shortness of Breath Yes;Fear of Shortness of Breath;Limiting activity      Exercise Target Goals: Date: 09/13/15  Exercise Program Goal: Individual exercise prescription set with THRR, safety & activity barriers. Participant demonstrates ability to understand and report RPE using BORG scale, to self-measure pulse accurately, and to acknowledge the importance of the exercise prescription.  Exercise Prescription Goal: Starting with aerobic activity 30 plus minutes a day, 3 days  per week for initial exercise prescription. Provide home exercise prescription and guidelines that participant acknowledges understanding prior to discharge.  Activity Barriers & Risk Stratification:     Activity Barriers & Cardiac Risk Stratification - 09/13/15 0900    Activity Barriers & Cardiac Risk Stratification   Activity Barriers Shortness of Breath;Deconditioning   Cardiac Risk Stratification Low      6 Minute Walk:     6 Minute Walk      09/13/15 1020       6 Minute Walk   Phase Initial     Distance 1700 feet     Walk Time 6 minutes     # of Rest Breaks 0     MPH 3.2     RPE 13     Perceived Dyspnea  3     Resting HR 87 bpm     Resting BP 148/72 mmHg     Max Ex. HR 116 bpm     Max Ex. BP 158/78 mmHg     Interval Oxygen   Interval Oxygen? Yes     Baseline Oxygen Saturation % 97 %     6 Minute Oxygen Saturation % 98 %        Initial Exercise  Prescription:     Initial Exercise Prescription - 09/13/15 1000    Date of Initial Exercise RX and Referring Provider   Date 09/13/15   Treadmill   MPH 2.5   Grade 0   Minutes 15   Recumbant Bike   Level 2   Watts 20   Minutes 15   NuStep   Level 2   Watts 40   Minutes 15   Recumbant Elliptical   Level 2   Watts 20   Minutes 15   Elliptical   Level 1   Speed 3   Minutes 5   REL-XR   Level 2   Watts 50   Minutes 15   T5 Nustep   Level 2   Watts 40   Minutes 15   Biostep-RELP   Level 2   Watts 25   Minutes 15   Prescription Details   Frequency (times per week) 2   Duration Progress to 45 minutes of aerobic exercise without signs/symptoms of physical distress   Intensity   THRR REST +  30   Ratings of Perceived Exertion 11-15   Perceived Dyspnea 0-4   Progression   Progression Continue to progress workloads to maintain intensity without signs/symptoms of physical distress.   Resistance Training   Training Prescription Yes   Weight 3   Reps 10-15      Perform Capillary Blood Glucose checks as needed.  Exercise Prescription Changes:   Exercise Comments:   Discharge Exercise Prescription (Final Exercise Prescription Changes):    Nutrition:  Target Goals: Understanding of nutrition guidelines, daily intake of sodium 1500mg , cholesterol 200mg , calories 30% from fat and 7% or less from saturated fats, daily to have 5 or more servings of fruits and vegetables.  Biometrics:     Pre Biometrics - 09/13/15 1019    Pre Biometrics   Height 5' 8.5" (1.74 m)   Weight 183 lb 3.2 oz (83.099 kg)   Waist Circumference 43 inches   Hip Circumference 41.5 inches   Waist to Hip Ratio 1.04 %   BMI (Calculated) 27.5       Nutrition Therapy Plan and Nutrition Goals:     Nutrition Therapy & Goals - 09/13/15 0900    Nutrition  Therapy   Diet Arthur Owens is interested in meeting with the dietitan. He has gained weight since he stopped smoking. He is working on  more protein and fruit, smaller dinner portion size, and drinking more water. He has lost 8lbs recently.      Nutrition Discharge: Rate Your Plate Scores:   Psychosocial: Target Goals: Acknowledge presence or absence of depression, maximize coping skills, provide positive support system. Participant is able to verbalize types and ability to use techniques and skills needed for reducing stress and depression.  Initial Review & Psychosocial Screening:     Initial Psych Review & Screening - 09/13/15 1313    Barriers   Psychosocial barriers to participate in program The patient should benefit from training in stress management and relaxation.;There are no identifiable barriers or psychosocial needs.   Screening Interventions   Interventions Encouraged to exercise      Quality of Life Scores:     Quality of Life - 09/13/15 0900    Quality of Life Scores   Health/Function Pre 21.41 %   Socioeconomic Pre 21.93 %   Psych/Spiritual Pre 21.14 %   Family Pre 22.7 %   GLOBAL Pre 21.64 %      PHQ-9:     Recent Review Flowsheet Data    Depression screen [facility] 2/9 09/13/2015   Decreased Interest 1   Down, Depressed, Hopeless 1   PHQ - 2 Score 2   Altered sleeping 2   Tired, decreased energy 3   Change in appetite 2   Feeling bad or failure about yourself  1   Trouble concentrating 0   Moving slowly or fidgety/restless 0   Suicidal thoughts 0   PHQ-9 Score 10   Difficult doing work/chores Somewhat difficult      Psychosocial Evaluation and Intervention:   Psychosocial Re-Evaluation:  Education: Education Goals: Education classes will be provided on a weekly basis, covering required topics. Participant will state understanding/return demonstration of topics presented.  Learning Barriers/Preferences:     Learning Barriers/Preferences - 09/13/15 0900    Learning Barriers/Preferences   Learning Barriers None   Learning Preferences Group Instruction;Individual  Instruction;Pictoral;Skilled Demonstration;Verbal Instruction;Video;Written Material      Education Topics: Initial Evaluation Education: - Verbal, written and demonstration of respiratory meds, RPE/PD scales, oximetry and breathing techniques. Instruction on use of nebulizers and MDIs: cleaning and proper use, rinsing mouth with steroid doses and importance of monitoring MDI activations.          Pulmonary Rehab from 09/13/2015 in [facility] Cardiac and Pulmonary Rehab   Date  09/13/15   Educator  LB   Instruction Review Code  2- meets goals/outcomes      General Nutrition Guidelines/Fats and Fiber: -Group instruction provided by verbal, written material, models and posters to present the general guidelines for heart healthy nutrition. Gives an explanation and review of dietary fats and fiber.   Controlling Sodium/Reading Food Labels: -Group verbal and written material supporting the discussion of sodium use in heart healthy nutrition. Review and explanation with models, verbal and written materials for utilization of the food label.   Exercise Physiology & Risk Factors: - Group verbal and written instruction with models to review the exercise physiology of the cardiovascular system and associated critical values. Details cardiovascular disease risk factors and the goals associated with each risk factor.   Aerobic Exercise & Resistance Training: - Gives group verbal and written discussion on the health impact of inactivity. On the components of aerobic and resistive training programs  and the benefits of this training and how to safely progress through these programs.   Flexibility, Balance, General Exercise Guidelines: - Provides group verbal and written instruction on the benefits of flexibility and balance training programs. Provides general exercise guidelines with specific guidelines to those with heart or lung disease. Demonstration and skill practice provided.   Stress  Management: - Provides group verbal and written instruction about the health risks of elevated stress, cause of high stress, and healthy ways to reduce stress.   Depression: - Provides group verbal and written instruction on the correlation between heart/lung disease and depressed mood, treatment options, and the stigmas associated with seeking treatment.   Exercise & Equipment Safety: - Individual verbal instruction and demonstration of equipment use and safety with use of the equipment.   Infection Prevention: - Provides verbal and written material to individual with discussion of infection control including proper hand washing and proper equipment cleaning during exercise session.   Falls Prevention: - Provides verbal and written material to individual with discussion of falls prevention and safety.      Pulmonary Rehab from 09/13/2015 in [facility] Cardiac and Pulmonary Rehab   Date  09/13/15   Educator  LB   Instruction Review Code  2- meets goals/outcomes      Diabetes: - Individual verbal and written instruction to review signs/symptoms of diabetes, desired ranges of glucose level fasting, after meals and with exercise. Advice that pre and post exercise glucose checks will be done for 3 sessions at entry of program.   Chronic Lung Diseases: - Group verbal and written instruction to review new updates, new respiratory medications, new advancements in procedures and treatments. Provide informative websites and "800" numbers of self-education.   Lung Procedures: - Group verbal and written instruction to describe testing methods done to diagnose lung disease. Review the outcome of test results. Describe the treatment choices: Pulmonary Function Tests, ABGs and oximetry.   Energy Conservation: - Provide group verbal and written instruction for methods to conserve energy, plan and organize activities. Instruct on pacing techniques, use of adaptive equipment and posture/positioning to  relieve shortness of breath.   Triggers: - Group verbal and written instruction to review types of environmental controls: home humidity, furnaces, filters, dust mite/pet prevention, HEPA vacuums. To discuss weather changes, air quality and the benefits of nasal washing.   Exacerbations: - Group verbal and written instruction to provide: warning signs, infection symptoms, calling MD promptly, preventive modes, and value of vaccinations. Review: effective airway clearance, coughing and/or vibration techniques. Create an Sports administrator.   Oxygen: - Individual and group verbal and written instruction on oxygen therapy. Includes supplement oxygen, available portable oxygen systems, continuous and intermittent flow rates, oxygen safety, concentrators, and Medicare reimbursement for oxygen.   Respiratory Medications: - Group verbal and written instruction to review medications for lung disease. Drug class, frequency, complications, importance of spacers, rinsing mouth after steroid MDI's, and proper cleaning methods for nebulizers.      Pulmonary Rehab from 09/13/2015 in [facility] Cardiac and Pulmonary Rehab   Date  09/13/15   Educator  LB   Instruction Review Code  2- meets goals/outcomes      AED/CPR: - Group verbal and written instruction with the use of models to demonstrate the basic use of the AED with the basic ABC's of resuscitation.   Breathing Retraining: - Provides individuals verbal and written instruction on purpose, frequency, and proper technique of diaphragmatic breathing and pursed-lipped breathing. Applies individual practice skills.  Pulmonary Rehab from 09/13/2015 in [facility] Cardiac and Pulmonary Rehab   Date  09/13/15   Educator  LB   Instruction Review Code  2- meets goals/outcomes      Anatomy and Physiology of the Lungs: - Group verbal and written instruction with the use of models to provide basic lung anatomy and physiology related to function, structure and  complications of lung disease.   Heart Failure: - Group verbal and written instruction on the basics of heart failure: signs/symptoms, treatments, explanation of ejection fraction, enlarged heart and cardiomyopathy.   Sleep Apnea: - Individual verbal and written instruction to review Obstructive Sleep Apnea. Review of risk factors, methods for diagnosing and types of masks and machines for OSA.   Anxiety: - Provides group, verbal and written instruction on the correlation between heart/lung disease and anxiety, treatment options, and management of anxiety.   Relaxation: - Provides group, verbal and written instruction about the benefits of relaxation for patients with heart/lung disease. Also provides patients with examples of relaxation techniques.   Knowledge Questionnaire Score:     Knowledge Questionnaire Score - 09/13/15 0900    Knowledge Questionnaire Score   Pre Score 9/10       Core Components/Risk Factors/Patient Goals at Admission:     Personal Goals and Risk Factors at Admission - 09/13/15 0900    Core Components/Risk Factors/Patient Goals on Admission    Weight Management Yes;Weight Loss   Intervention Weight Management: Develop a combined nutrition and exercise program designed to reach desired caloric intake, while maintaining appropriate intake of nutrient and fiber, sodium and fats, and appropriate energy expenditure required for the weight goal.;Weight Management: Provide education and appropriate resources to help participant work on and attain dietary goals.;Weight Management/Obesity: Establish reasonable short term and long term weight goals.;Obesity: Provide education and appropriate resources to help participant work on and attain dietary goals.  Arthur Owens is interested in meeting with the dietitan. He has gained weight since he stopped smoking. He is working on more protein and fruit, smaller dinner portion size, and drinking more water. He has lost 8lbs  recently   Admit Weight 194 lb (87.998 kg)   Goal Weight: Short Term 185 lb (83.915 kg)   Goal Weight: Long Term 170 lb (77.111 kg)   Expected Outcomes Short Term: Continue to assess and modify interventions until short term weight is achieved   Increase Strength and Stamina Yes   Intervention Provide advice, education, support and counseling about physical activity/exercise needs.;Develop an individualized exercise prescription for aerobic and resistive training based on initial evaluation findings, risk stratification, comorbidities and participant's personal goals.  Arthur Owens is very active - yardwork and activity in the house. He walks once a week with a group and just joined [employer] in [place].   Expected Outcomes Achievement of increased cardiorespiratory fitness and enhanced flexibility, muscular endurance and strength shown through measurements of functional capacity and personal statement of participant.   Improve shortness of breath with ADL's Yes   Intervention Provide education, individualized exercise plan and daily activity instruction to help decrease symptoms of SOB with activities of daily living.   Expected Outcomes Short Term: Achieves a reduction of symptoms when performing activities of daily living.   Develop more efficient breathing techniques such as purse lipped breathing and diaphragmatic breathing; and practicing self-pacing with activity Yes   Intervention Provide education, demonstration and support about specific breathing techniuqes utilized for more efficient breathing. Include techniques such as pursed lipped breathing, diaphragmatic breathing and self-pacing  activity.   Expected Outcomes Short Term: Participant will be able to demonstrate and use breathing techniques as needed throughout daily activities.   Increase knowledge of respiratory medications and ability to use respiratory devices properly  Yes  Arthur Owens uses Spiriva and rarely ProAir.   Intervention  Provide education and demonstration as needed of appropriate use of medications, inhalers, and oxygen therapy.   Expected Outcomes Short Term: Achieves understanding of medications use. Understands that oxygen is a medication prescribed by physician. Demonstrates appropriate use of inhaler and oxygen therapy.      Core Components/Risk Factors/Patient Goals Review:    Core Components/Risk Factors/Patient Goals at Discharge (Final Review):    ITP Comments:   Comments: Arthur Owens plans to start [facility] on 09/19/2015 and will attend 2 days/week.

## 2015-09-13 NOTE — Patient Instructions (Signed)
Patient Instructions  Patient Details  Name: Arthur Owens MRN: [id number] Date of Birth: [birth year] Referring Provider:  Erby Pian, MD  Below are the personal goals you chose as well as exercise and nutrition goals. Our goal is to help you keep on track towards obtaining and maintaining your goals. We will be discussing your progress on these goals with you throughout the program.  Initial Exercise Prescription:     Initial Exercise Prescription - 09/13/15 1000    Date of Initial Exercise RX and Referring Provider   Date 09/13/15   Treadmill   MPH 2.5   Grade 0   Minutes 15   Recumbant Bike   Level 2   Watts 20   Minutes 15   NuStep   Level 2   Watts 40   Minutes 15   Recumbant Elliptical   Level 2   Watts 20   Minutes 15   Elliptical   Level 1   Speed 3   Minutes 5   REL-XR   Level 2   Watts 50   Minutes 15   T5 Nustep   Level 2   Watts 40   Minutes 15   Biostep-RELP   Level 2   Watts 25   Minutes 15   Prescription Details   Frequency (times per week) 2   Duration Progress to 45 minutes of aerobic exercise without signs/symptoms of physical distress   Intensity   THRR REST +  30   Ratings of Perceived Exertion 11-15   Perceived Dyspnea 0-4   Progression   Progression Continue to progress workloads to maintain intensity without signs/symptoms of physical distress.   Resistance Training   Training Prescription Yes   Weight 3   Reps 10-15      Exercise Goals: Frequency: Be able to perform aerobic exercise three times per week working toward 3-5 days per week.  Intensity: Work with a perceived exertion of 11 (fairly light) - 15 (hard) as tolerated. Follow your new exercise prescription and watch for changes in prescription as you progress with the program. Changes will be reviewed with you when they are made.  Duration: You should be able to do 30 minutes of continuous aerobic exercise in addition to a 5 minute warm-up and a 5 minute cool-down  routine.  Nutrition Goals: Your personal nutrition goals will be established when you do your nutrition analysis with the dietician.  The following are nutrition guidelines to follow: Cholesterol < 200mg /day Sodium < 1500mg /day Fiber: Men over 50 yrs - 30 grams per day  Personal Goals:     Personal Goals and Risk Factors at Admission - 09/13/15 0900    Core Components/Risk Factors/Patient Goals on Admission    Weight Management Yes;Weight Loss   Intervention Weight Management: Develop a combined nutrition and exercise program designed to reach desired caloric intake, while maintaining appropriate intake of nutrient and fiber, sodium and fats, and appropriate energy expenditure required for the weight goal.;Weight Management: Provide education and appropriate resources to help participant work on and attain dietary goals.;Weight Management/Obesity: Establish reasonable short term and long term weight goals.;Obesity: Provide education and appropriate resources to help participant work on and attain dietary goals.  Arthur Owens is interested in meeting with the dietitan. He has gained weight since he stopped smoking. He is working on more protein and fruit, smaller dinner portion size, and drinking more water. He has lost 8lbs recently   Admit Weight 194 lb (87.998 kg)   Goal Weight:  Short Term 185 lb (83.915 kg)   Goal Weight: Long Term 170 lb (77.111 kg)   Expected Outcomes Short Term: Continue to assess and modify interventions until short term weight is achieved   Increase Strength and Stamina Yes   Intervention Provide advice, education, support and counseling about physical activity/exercise needs.;Develop an individualized exercise prescription for aerobic and resistive training based on initial evaluation findings, risk stratification, comorbidities and participant's personal goals.  Arthur Owens is very active - yardwork and activity in the house. He walks once a week with a group and just joined  [employer] in [place].   Expected Outcomes Achievement of increased cardiorespiratory fitness and enhanced flexibility, muscular endurance and strength shown through measurements of functional capacity and personal statement of participant.   Improve shortness of breath with ADL's Yes   Intervention Provide education, individualized exercise plan and daily activity instruction to help decrease symptoms of SOB with activities of daily living.   Expected Outcomes Short Term: Achieves a reduction of symptoms when performing activities of daily living.   Develop more efficient breathing techniques such as purse lipped breathing and diaphragmatic breathing; and practicing self-pacing with activity Yes   Intervention Provide education, demonstration and support about specific breathing techniuqes utilized for more efficient breathing. Include techniques such as pursed lipped breathing, diaphragmatic breathing and self-pacing activity.   Expected Outcomes Short Term: Participant will be able to demonstrate and use breathing techniques as needed throughout daily activities.   Increase knowledge of respiratory medications and ability to use respiratory devices properly  Yes  Arthur Owens uses Spiriva and rarely ProAir.   Intervention Provide education and demonstration as needed of appropriate use of medications, inhalers, and oxygen therapy.   Expected Outcomes Short Term: Achieves understanding of medications use. Understands that oxygen is a medication prescribed by physician. Demonstrates appropriate use of inhaler and oxygen therapy.      Tobacco Use Initial Evaluation: History  Smoking status   Former Smoker -- 2.00 packs/day for 15 years   Types: Cigarettes  Smokeless tobacco   Never Used    Copy of goals given to participant.

## 2015-09-19 ENCOUNTER — Encounter: Payer: PPO | Admitting: *Deleted

## 2015-09-19 DIAGNOSIS — J449 Chronic obstructive pulmonary disease, unspecified: Secondary | ICD-10-CM | POA: Diagnosis not present

## 2015-09-19 NOTE — Progress Notes (Signed)
Daily Session Note  Patient Details  Name: Arthur Owens MRN: [id number] Date of Birth: [birth year] Referring Provider:    Encounter Date: 09/19/2015  Check In:     Session Check In - 09/19/15 1203    Check-In   Location [facility]-Cardiac & Pulmonary Rehab   Staff Present Gerlene Burdock, RN, BSN;Laureen Owens Shark, BS, RRT, Respiratory Therapist;Diane Joya Gaskins, RN, BSN   Supervising physician immediately available to respond to emergencies [facility] immediately available ER MD   Physician(s) Dr. Jimmye Norman and Dr. Reita Cliche   Medication changes reported     No   Fall or balance concerns reported    No   Warm-up and Cool-down Performed on first and last piece of equipment   VAD Patient? No   Pain Assessment   Currently in Pain? No/denies         Goals Met:  Proper associated with RPD/PD & O2 Sat Exercise tolerated well  Goals Unmet:  Not Applicable  Comments:     Dr. Emily Filbert is Medical Director for [facility] and [facility].

## 2015-09-21 ENCOUNTER — Telehealth: Payer: Self-pay | Admitting: Respiratory Therapy

## 2015-09-21 NOTE — Telephone Encounter (Signed)
Arthur Owens called and requested discharge from [facility] - he has had something come up that needs his attention. He will contact us in the future if he plans to return.

## 2015-09-27 ENCOUNTER — Encounter: Payer: Self-pay | Admitting: Respiratory Therapy

## 2015-09-27 DIAGNOSIS — J449 Chronic obstructive pulmonary disease, unspecified: Secondary | ICD-10-CM

## 2015-09-27 NOTE — Progress Notes (Signed)
Pulmonary Individual Treatment Plan  Patient Details  Name: Arthur Owens MRN: [id number] Date of Birth: 1941-03-12 Referring Provider:  Dr Wallene Huh  Initial Encounter Date:       Pulmonary Rehab from 09/13/2015 in [facility] Cardiac and Pulmonary Rehab   Date  09/13/15      Visit Diagnosis: COPD, mild ([place])  Patient's Home Medications on Admission:  Current outpatient prescriptions:    albuterol (PROVENTIL HFA;VENTOLIN HFA) 108 (90 BASE) MCG/ACT inhaler, Inhale 2 puffs into the lungs every 6 (six) hours as needed for wheezing or shortness of breath., Disp: , Rfl:    atorvastatin (LIPITOR) 40 MG tablet, Take 40 mg by mouth daily. PM, Disp: , Rfl:    cetirizine (ZYRTEC) 10 MG tablet, Take 10 mg by mouth daily. AM, Disp: , Rfl:    fluticasone (FLONASE) 50 MCG/ACT nasal spray, Place 2 sprays into both nostrils as needed. , Disp: , Rfl:    hydrochlorothiazide (HYDRODIURIL) 12.5 MG tablet, Take 12.5 mg by mouth daily. AM, Disp: , Rfl:    methylphenidate (METADATE CD) 20 MG CR capsule, Take 20 mg by mouth every morning., Disp: , Rfl:    omeprazole (PRILOSEC) 20 MG capsule, Take 20 mg by mouth 2 (two) times daily before a meal. , Disp: , Rfl:    tamsulosin (FLOMAX) 0.4 MG CAPS capsule, Take 0.4 mg by mouth. EVE, Disp: , Rfl:    tiotropium (SPIRIVA) 18 MCG inhalation capsule, Place 18 mcg into inhaler and inhale daily. AM, Disp: , Rfl:   Past Medical History: Past Medical History  Diagnosis Date   Hyperlipidemia    GERD (gastroesophageal reflux disease)    COPD (chronic obstructive pulmonary disease) (HCC)    Bladder neck obstruction    Shortness of breath dyspnea    ADD (attention deficit disorder)    Cancer (HCC)     H/O ADENOMATOUS POLYP OF COLON   Seasonal allergies     Tobacco Use: History  Smoking status   Former Smoker -- 2.00 packs/day for 15 years   Types: Cigarettes  Smokeless tobacco   Never Used    Labs: Recent Review Flowsheet Data    There  is no flowsheet data to display.       ADL [facility]:     Pulmonary Assessment Scores      09/13/15 0900       ADL UCSD   ADL Phase Entry     SOB Score total 56     Rest 0     Walk 3     Stairs 3     Bath 3     Dress 3     Shop 2        Pulmonary Function Assessment:     Pulmonary Function Assessment - 09/13/15 0900    Pulmonary Function Tests   RV% 85 %   DLCO% 101 %   Initial Spirometry Results   FVC% 116 %   FEV1% 102 %   FEV1/FVC Ratio 69   Comments PFT 01/06/15   Breath   Bilateral Breath Sounds Clear   Shortness of Breath Yes;Fear of Shortness of Breath;Limiting activity      Exercise Target Goals:    Exercise Program Goal: Individual exercise prescription set with THRR, safety & activity barriers. Participant demonstrates ability to understand and report RPE using BORG scale, to self-measure pulse accurately, and to acknowledge the importance of the exercise prescription.  Exercise Prescription Goal: Starting with aerobic activity 30 plus minutes a day, 3 days  per week for initial exercise prescription. Provide home exercise prescription and guidelines that participant acknowledges understanding prior to discharge.  Activity Barriers & Risk Stratification:     Activity Barriers & Cardiac Risk Stratification - 09/13/15 0900    Activity Barriers & Cardiac Risk Stratification   Activity Barriers Shortness of Breath;Deconditioning   Cardiac Risk Stratification Low      6 Minute Walk:     6 Minute Walk      09/13/15 1020       6 Minute Walk   Phase Initial     Distance 1700 feet     Walk Time 6 minutes     # of Rest Breaks 0     MPH 3.2     RPE 13     Perceived Dyspnea  3     Resting HR 87 bpm     Resting BP 148/72 mmHg     Max Ex. HR 116 bpm     Max Ex. BP 158/78 mmHg     Interval Oxygen   Interval Oxygen? Yes     Baseline Oxygen Saturation % 97 %     6 Minute Oxygen Saturation % 98 %        Initial Exercise Prescription:      Initial Exercise Prescription - 09/13/15 1000    Date of Initial Exercise RX and Referring Provider   Date 09/13/15   Treadmill   MPH 2.5   Grade 0   Minutes 15   Recumbant Bike   Level 2   Watts 20   Minutes 15   NuStep   Level 2   Watts 40   Minutes 15   Recumbant Elliptical   Level 2   Watts 20   Minutes 15   Elliptical   Level 1   Speed 3   Minutes 5   REL-XR   Level 2   Watts 50   Minutes 15   T5 Nustep   Level 2   Watts 40   Minutes 15   Biostep-RELP   Level 2   Watts 25   Minutes 15   Prescription Details   Frequency (times per week) 2   Duration Progress to 45 minutes of aerobic exercise without signs/symptoms of physical distress   Intensity   THRR REST +  30   Ratings of Perceived Exertion 11-15   Perceived Dyspnea 0-4   Progression   Progression Continue to progress workloads to maintain intensity without signs/symptoms of physical distress.   Resistance Training   Training Prescription Yes   Weight 3   Reps 10-15      Perform Capillary Blood Glucose checks as needed.  Exercise Prescription Changes:     Exercise Prescription Changes      09/19/15 1400           Response to Exercise   Blood Pressure (Admit) 119/60 mmHg       Blood Pressure (Exercise) 180/90 mmHg       Blood Pressure (Exit) 122/82 mmHg       Heart Rate (Admit) 103 bpm       Heart Rate (Exercise) 118 bpm       Heart Rate (Exit) 99 bpm       Oxygen Saturation (Admit) 97 %       Oxygen Saturation (Exercise) 95 %       Oxygen Saturation (Exit) 95 %       Rating of Perceived Exertion (Exercise) 13  Perceived Dyspnea (Exercise) 3       Symptoms none       Comments First day of exercise in [facility]. Arthur Owens accomplished his exercise goals with acceptable vital signs and no symptoms.       Resistance Training   Training Prescription Yes       Weight 5       Reps 10-15       Treadmill   MPH 2.5       Grade 0       Minutes 15       NuStep   Level 2        Watts 115       Minutes 10       REL-XR   Level 2       Watts 50       Minutes 15          Exercise Comments:     Exercise Comments      09/19/15 1205           Exercise Comments Arthur Owens only wanted to use his feet on the Nustep exercise equipment since he had left hand surgery recently.           Discharge Exercise Prescription (Final Exercise Prescription Changes):     Exercise Prescription Changes - 09/19/15 1400    Response to Exercise   Blood Pressure (Admit) 119/60 mmHg   Blood Pressure (Exercise) 180/90 mmHg   Blood Pressure (Exit) 122/82 mmHg   Heart Rate (Admit) 103 bpm   Heart Rate (Exercise) 118 bpm   Heart Rate (Exit) 99 bpm   Oxygen Saturation (Admit) 97 %   Oxygen Saturation (Exercise) 95 %   Oxygen Saturation (Exit) 95 %   Rating of Perceived Exertion (Exercise) 13   Perceived Dyspnea (Exercise) 3   Symptoms none   Comments First day of exercise in [facility]. Arthur Owens accomplished his exercise goals with acceptable vital signs and no symptoms.   Resistance Training   Training Prescription Yes   Weight 5   Reps 10-15   Treadmill   MPH 2.5   Grade 0   Minutes 15   NuStep   Level 2   Watts 115   Minutes 10   REL-XR   Level 2   Watts 50   Minutes 15       Nutrition:  Target Goals: Understanding of nutrition guidelines, daily intake of sodium 1500mg , cholesterol 200mg , calories 30% from fat and 7% or less from saturated fats, daily to have 5 or more servings of fruits and vegetables.  Biometrics:     Pre Biometrics - 09/13/15 1019    Pre Biometrics   Height 5' 8.5" (1.74 m)   Weight 183 lb 3.2 oz (83.099 kg)   Waist Circumference 43 inches   Hip Circumference 41.5 inches   Waist to Hip Ratio 1.04 %   BMI (Calculated) 27.5       Nutrition Therapy Plan and Nutrition Goals:     Nutrition Therapy & Goals - 09/13/15 0900    Nutrition Therapy   Diet Arthur Owens is interested in meeting with the dietitan. He has gained weight since he  stopped smoking. He is working on more protein and fruit, smaller dinner portion size, and drinking more water. He has lost 8lbs recently.      Nutrition Discharge: Rate Your Plate Scores:   Psychosocial: Target Goals: Acknowledge presence or absence of depression, maximize coping skills, provide positive support  system. Participant is able to verbalize types and ability to use techniques and skills needed for reducing stress and depression.  Initial Review & Psychosocial Screening:     Initial Psych Review & Screening - 09/13/15 1313    Barriers   Psychosocial barriers to participate in program The patient should benefit from training in stress management and relaxation.;There are no identifiable barriers or psychosocial needs.   Screening Interventions   Interventions Encouraged to exercise      Quality of Life Scores:     Quality of Life - 09/13/15 0900    Quality of Life Scores   Health/Function Pre 21.41 %   Socioeconomic Pre 21.93 %   Psych/Spiritual Pre 21.14 %   Family Pre 22.7 %   GLOBAL Pre 21.64 %      PHQ-9:     Recent Review Flowsheet Data    Depression screen [facility] 2/9 09/13/2015   Decreased Interest 1   Down, Depressed, Hopeless 1   PHQ - 2 Score 2   Altered sleeping 2   Tired, decreased energy 3   Change in appetite 2   Feeling bad or failure about yourself  1   Trouble concentrating 0   Moving slowly or fidgety/restless 0   Suicidal thoughts 0   PHQ-9 Score 10   Difficult doing work/chores Somewhat difficult      Psychosocial Evaluation and Intervention:   Psychosocial Re-Evaluation:  Education: Education Goals: Education classes will be provided on a weekly basis, covering required topics. Participant will state understanding/return demonstration of topics presented.  Learning Barriers/Preferences:     Learning Barriers/Preferences - 09/13/15 0900    Learning Barriers/Preferences   Learning Barriers None   Learning Preferences Group  Instruction;Individual Instruction;Pictoral;Skilled Demonstration;Verbal Instruction;Video;Written Material      Education Topics: Initial Evaluation Education: - Verbal, written and demonstration of respiratory meds, RPE/PD scales, oximetry and breathing techniques. Instruction on use of nebulizers and MDIs: cleaning and proper use, rinsing mouth with steroid doses and importance of monitoring MDI activations.          Pulmonary Rehab from 09/19/2015 in [facility] Cardiac and Pulmonary Rehab   Date  09/13/15   Educator  LB   Instruction Review Code  2- meets goals/outcomes      General Nutrition Guidelines/Fats and Fiber: -Group instruction provided by verbal, written material, models and posters to present the general guidelines for heart healthy nutrition. Gives an explanation and review of dietary fats and fiber.   Controlling Sodium/Reading Food Labels: -Group verbal and written material supporting the discussion of sodium use in heart healthy nutrition. Review and explanation with models, verbal and written materials for utilization of the food label.   Exercise Physiology & Risk Factors: - Group verbal and written instruction with models to review the exercise physiology of the cardiovascular system and associated critical values. Details cardiovascular disease risk factors and the goals associated with each risk factor.   Aerobic Exercise & Resistance Training: - Gives group verbal and written discussion on the health impact of inactivity. On the components of aerobic and resistive training programs and the benefits of this training and how to safely progress through these programs.   Flexibility, Balance, General Exercise Guidelines: - Provides group verbal and written instruction on the benefits of flexibility and balance training programs. Provides general exercise guidelines with specific guidelines to those with heart or lung disease. Demonstration and skill practice  provided.   Stress Management: - Provides group verbal and written instruction about the health  risks of elevated stress, cause of high stress, and healthy ways to reduce stress.   Depression: - Provides group verbal and written instruction on the correlation between heart/lung disease and depressed mood, treatment options, and the stigmas associated with seeking treatment.   Exercise & Equipment Safety: - Individual verbal instruction and demonstration of equipment use and safety with use of the equipment.      Pulmonary Rehab from 09/19/2015 in [facility] Cardiac and Pulmonary Rehab   Date  09/19/15   Educator  C. EnterkinRN   Instruction Review Code  1- partially meets, needs review/practice      Infection Prevention: - Provides verbal and written material to individual with discussion of infection control including proper hand washing and proper equipment cleaning during exercise session.   Falls Prevention: - Provides verbal and written material to individual with discussion of falls prevention and safety.      Pulmonary Rehab from 09/19/2015 in [facility] Cardiac and Pulmonary Rehab   Date  09/13/15   Educator  LB   Instruction Review Code  2- meets goals/outcomes      Diabetes: - Individual verbal and written instruction to review signs/symptoms of diabetes, desired ranges of glucose level fasting, after meals and with exercise. Advice that pre and post exercise glucose checks will be done for 3 sessions at entry of program.   Chronic Lung Diseases: - Group verbal and written instruction to review new updates, new respiratory medications, new advancements in procedures and treatments. Provide informative websites and "800" numbers of self-education.   Lung Procedures: - Group verbal and written instruction to describe testing methods done to diagnose lung disease. Review the outcome of test results. Describe the treatment choices: Pulmonary Function Tests, ABGs and  oximetry.   Energy Conservation: - Provide group verbal and written instruction for methods to conserve energy, plan and organize activities. Instruct on pacing techniques, use of adaptive equipment and posture/positioning to relieve shortness of breath.   Triggers: - Group verbal and written instruction to review types of environmental controls: home humidity, furnaces, filters, dust mite/pet prevention, HEPA vacuums. To discuss weather changes, air quality and the benefits of nasal washing.   Exacerbations: - Group verbal and written instruction to provide: warning signs, infection symptoms, calling MD promptly, preventive modes, and value of vaccinations. Review: effective airway clearance, coughing and/or vibration techniques. Create an Sports administrator.   Oxygen: - Individual and group verbal and written instruction on oxygen therapy. Includes supplement oxygen, available portable oxygen systems, continuous and intermittent flow rates, oxygen safety, concentrators, and Medicare reimbursement for oxygen.   Respiratory Medications: - Group verbal and written instruction to review medications for lung disease. Drug class, frequency, complications, importance of spacers, rinsing mouth after steroid MDI's, and proper cleaning methods for nebulizers.      Pulmonary Rehab from 09/19/2015 in [facility] Cardiac and Pulmonary Rehab   Date  09/13/15   Educator  LB   Instruction Review Code  2- meets goals/outcomes      AED/CPR: - Group verbal and written instruction with the use of models to demonstrate the basic use of the AED with the basic ABC's of resuscitation.   Breathing Retraining: - Provides individuals verbal and written instruction on purpose, frequency, and proper technique of diaphragmatic breathing and pursed-lipped breathing. Applies individual practice skills.      Pulmonary Rehab from 09/19/2015 in [facility] Cardiac and Pulmonary Rehab   Date  09/13/15   Educator  LB   Instruction  Review Code  2- meets  goals/outcomes      Anatomy and Physiology of the Lungs: - Group verbal and written instruction with the use of models to provide basic lung anatomy and physiology related to function, structure and complications of lung disease.   Heart Failure: - Group verbal and written instruction on the basics of heart failure: signs/symptoms, treatments, explanation of ejection fraction, enlarged heart and cardiomyopathy.   Sleep Apnea: - Individual verbal and written instruction to review Obstructive Sleep Apnea. Review of risk factors, methods for diagnosing and types of masks and machines for OSA.   Anxiety: - Provides group, verbal and written instruction on the correlation between heart/lung disease and anxiety, treatment options, and management of anxiety.   Relaxation: - Provides group, verbal and written instruction about the benefits of relaxation for patients with heart/lung disease. Also provides patients with examples of relaxation techniques.   Knowledge Questionnaire Score:     Knowledge Questionnaire Score - 09/13/15 0900    Knowledge Questionnaire Score   Pre Score 9/10       Core Components/Risk Factors/Patient Goals at Admission:     Personal Goals and Risk Factors at Admission - 09/13/15 0900    Core Components/Risk Factors/Patient Goals on Admission    Weight Management Yes;Weight Loss   Intervention Weight Management: Develop a combined nutrition and exercise program designed to reach desired caloric intake, while maintaining appropriate intake of nutrient and fiber, sodium and fats, and appropriate energy expenditure required for the weight goal.;Weight Management: Provide education and appropriate resources to help participant work on and attain dietary goals.;Weight Management/Obesity: Establish reasonable short term and long term weight goals.;Obesity: Provide education and appropriate resources to help participant work on and attain dietary  goals.  Arthur Owens is interested in meeting with the dietitan. He has gained weight since he stopped smoking. He is working on more protein and fruit, smaller dinner portion size, and drinking more water. He has lost 8lbs recently   Admit Weight 194 lb (87.998 kg)   Goal Weight: Short Term 185 lb (83.915 kg)   Goal Weight: Long Term 170 lb (77.111 kg)   Expected Outcomes Short Term: Continue to assess and modify interventions until short term weight is achieved   Increase Strength and Stamina Yes   Intervention Provide advice, education, support and counseling about physical activity/exercise needs.;Develop an individualized exercise prescription for aerobic and resistive training based on initial evaluation findings, risk stratification, comorbidities and participant's personal goals.  Arthur Owens is very active - yardwork and activity in the house. He walks once a week with a group and just joined [employer] in [place].   Expected Outcomes Achievement of increased cardiorespiratory fitness and enhanced flexibility, muscular endurance and strength shown through measurements of functional capacity and personal statement of participant.   Improve shortness of breath with ADL's Yes   Intervention Provide education, individualized exercise plan and daily activity instruction to help decrease symptoms of SOB with activities of daily living.   Expected Outcomes Short Term: Achieves a reduction of symptoms when performing activities of daily living.   Develop more efficient breathing techniques such as purse lipped breathing and diaphragmatic breathing; and practicing self-pacing with activity Yes   Intervention Provide education, demonstration and support about specific breathing techniuqes utilized for more efficient breathing. Include techniques such as pursed lipped breathing, diaphragmatic breathing and self-pacing activity.   Expected Outcomes Short Term: Participant will be able to demonstrate and use  breathing techniques as needed throughout daily activities.   Increase knowledge of respiratory  medications and ability to use respiratory devices properly  Yes  Arthur Owens uses Spiriva and rarely ProAir.   Intervention Provide education and demonstration as needed of appropriate use of medications, inhalers, and oxygen therapy.   Expected Outcomes Short Term: Achieves understanding of medications use. Understands that oxygen is a medication prescribed by physician. Demonstrates appropriate use of inhaler and oxygen therapy.      Core Components/Risk Factors/Patient Goals Review:      Goals and Risk Factor Review      09/19/15 1000           Core Components/Risk Factors/Patient Goals Review   Personal Goals Review Develop more efficient breathing techniques such as purse lipped breathing and diaphragmatic breathing and practicing self-pacing with activity.       Review Reviewed PLB technique - Arthur Owens used technique with exercise goals.        Expected Outcomes Continue using PLB with activities at home and exercise goals.          Core Components/Risk Factors/Patient Goals at Discharge (Final Review):      Goals and Risk Factor Review - 09/19/15 1000    Core Components/Risk Factors/Patient Goals Review   Personal Goals Review Develop more efficient breathing techniques such as purse lipped breathing and diaphragmatic breathing and practicing self-pacing with activity.   Review Reviewed PLB technique - Arthur Owens used technique with exercise goals.    Expected Outcomes Continue using PLB with activities at home and exercise goals.      ITP Comments:   Comments: Arthur Owens called 09/21/2015 and requested discharge from [place]. He has had something come up that needs his attention and will not be able to participate in [facility] at this time. Arthur Owens attended his initial evaluation and one exercise session of [facility].

## 2015-10-03 DIAGNOSIS — R339 Retention of urine, unspecified: Secondary | ICD-10-CM | POA: Diagnosis not present

## 2015-10-03 DIAGNOSIS — N320 Bladder-neck obstruction: Secondary | ICD-10-CM | POA: Diagnosis not present

## 2015-10-03 DIAGNOSIS — R34 Anuria and oliguria: Secondary | ICD-10-CM | POA: Diagnosis not present

## 2015-10-03 DIAGNOSIS — R350 Frequency of micturition: Secondary | ICD-10-CM | POA: Diagnosis not present

## 2015-10-06 ENCOUNTER — Emergency Department
Admission: EM | Admit: 2015-10-06 | Discharge: 2015-10-07 | Disposition: A | Discharge status: Home / Self Care | Payer: PPO | Attending: Emergency Medicine | Admitting: Emergency Medicine

## 2015-10-06 DIAGNOSIS — Z79899 Other long term (current) drug therapy: Secondary | ICD-10-CM | POA: Diagnosis not present

## 2015-10-06 DIAGNOSIS — Z85038 Personal history of other malignant neoplasm of large intestine: Secondary | ICD-10-CM | POA: Diagnosis not present

## 2015-10-06 DIAGNOSIS — Z792 Long term (current) use of antibiotics: Secondary | ICD-10-CM | POA: Diagnosis not present

## 2015-10-06 DIAGNOSIS — R339 Retention of urine, unspecified: Secondary | ICD-10-CM | POA: Insufficient documentation

## 2015-10-06 DIAGNOSIS — F909 Attention-deficit hyperactivity disorder, unspecified type: Secondary | ICD-10-CM | POA: Insufficient documentation

## 2015-10-06 DIAGNOSIS — Z87891 Personal history of nicotine dependence: Secondary | ICD-10-CM | POA: Insufficient documentation

## 2015-10-06 DIAGNOSIS — Z7951 Long term (current) use of inhaled steroids: Secondary | ICD-10-CM | POA: Diagnosis not present

## 2015-10-06 DIAGNOSIS — J449 Chronic obstructive pulmonary disease, unspecified: Secondary | ICD-10-CM | POA: Insufficient documentation

## 2015-10-06 DIAGNOSIS — E785 Hyperlipidemia, unspecified: Secondary | ICD-10-CM | POA: Diagnosis not present

## 2015-10-06 DIAGNOSIS — R1030 Lower abdominal pain, unspecified: Secondary | ICD-10-CM | POA: Diagnosis not present

## 2015-10-07 LAB — URINALYSIS COMPLETE WITH MICROSCOPIC (ARMC ONLY)
Bilirubin Urine: NEGATIVE
Glucose, UA: NEGATIVE mg/dL
HGB URINE DIPSTICK: NEGATIVE
Ketones, ur: NEGATIVE mg/dL
LEUKOCYTES UA: NEGATIVE
Nitrite: NEGATIVE
PH: 6 (ref 5.0–8.0)
PROTEIN: NEGATIVE mg/dL
SPECIFIC GRAVITY, URINE: 1.008 (ref 1.005–1.030)
Squamous Epithelial / LPF: NONE SEEN

## 2015-10-07 LAB — CBC
HEMATOCRIT: 45.6 % (ref 40.0–52.0)
HEMOGLOBIN: 15.9 g/dL (ref 13.0–18.0)
MCH: 32.2 pg (ref 26.0–34.0)
MCHC: 34.8 g/dL (ref 32.0–36.0)
MCV: 92.4 fL (ref 80.0–100.0)
Platelets: 270 10*3/uL (ref 150–440)
RBC: 4.93 MIL/uL (ref 4.40–5.90)
RDW: 13.2 % (ref 11.5–14.5)
WBC: 12.9 10*3/uL — ABNORMAL HIGH (ref 3.8–10.6)

## 2015-10-07 LAB — COMPREHENSIVE METABOLIC PANEL
ALK PHOS: 59 U/L (ref 38–126)
ALT: 42 U/L (ref 17–63)
AST: 41 U/L (ref 15–41)
Albumin: 5 g/dL (ref 3.5–5.0)
Anion gap: 11 (ref 5–15)
BILIRUBIN TOTAL: 1 mg/dL (ref 0.3–1.2)
BUN: 19 mg/dL (ref 6–20)
CALCIUM: 9.5 mg/dL (ref 8.9–10.3)
CHLORIDE: 97 mmol/L — AB (ref 101–111)
CO2: 23 mmol/L (ref 22–32)
CREATININE: 1.59 mg/dL — AB (ref 0.61–1.24)
GFR calc Af Amer: 48 mL/min — ABNORMAL LOW (ref >60)
GFR, EST NON AFRICAN AMERICAN: 41 mL/min — AB (ref >60)
Glucose, Bld: 112 mg/dL — ABNORMAL HIGH (ref 65–99)
Potassium: 3.9 mmol/L (ref 3.5–5.1)
Sodium: 131 mmol/L — ABNORMAL LOW (ref 135–145)
Total Protein: 8.3 g/dL — ABNORMAL HIGH (ref 6.5–8.1)

## 2015-10-07 NOTE — ED Provider Notes (Signed)
[facility] Emergency Department Provider Note   ____________________________________________  Time seen: Approximately 2357 PM  I have reviewed the triage vital signs and the nursing notes.   HISTORY  Chief Complaint Urinary Retention    HPI Arthur Owens is a 75 y.o. male who comes into the hospital today with abdominal pain. He reports that he has not been able to urinate well nor has he been able to have a bowel movement. He reports that the symptoms started on Sunday. He reports that he went in to see his doctor on Monday but was only able to see the nurse practitioner. He reports that he did urinate then and they checked labs. He was told that everything was okay but was given an antibiotic for a possible UTI. He reports that he's taken multiple doses of the antibiotic but he is still having some difficulty. He reports that his pain is been getting worse and his belly and his abdomen is swollen. He reports that he is dribbling a little bit and he has a lot of pressure in his belly. He denies back pain, nausea or vomiting. His pain is worse on the right side he rates his pain a 10 out of 10 in intensity. The patient is here for evaluation.   Past Medical History  Diagnosis Date  . Hyperlipidemia   . GERD (gastroesophageal reflux disease)   . COPD (chronic obstructive pulmonary disease) ([place])   . Bladder neck obstruction   . Shortness of breath dyspnea   . ADD (attention deficit disorder)   . Cancer ([place])     H/O ADENOMATOUS POLYP OF COLON  . Seasonal allergies     There are no active problems to display for this patient.   Past Surgical History  Procedure Laterality Date  . Adentamous colon polyp    . Left humerus debridement Left   . Appendectomy    . Tonsillectomy    . Colonoscopy with propofol N/A 01/28/2015    Procedure: COLONOSCOPY WITH PROPOFOL;  Surgeon: Manya Silvas, MD;  Location: [facility] ENDOSCOPY;  Service: Endoscopy;   Laterality: N/A;  . Esophagogastroduodenoscopy (egd) with propofol N/A 01/28/2015    Procedure: ESOPHAGOGASTRODUODENOSCOPY (EGD) WITH PROPOFOL;  Surgeon: Manya Silvas, MD;  Location: [facility] ENDOSCOPY;  Service: Endoscopy;  Laterality: N/A;  . Hernia repair Left     LEFT INGUINAL  . Trigger finger release Left 04/15/2015    Procedure: LEFT RING FINGER DUPUYTREN RELEASE;  Surgeon: Leanor Kail, MD;  Location: [facility];  Service: Orthopedics;  Laterality: Left;    Current Outpatient Rx  Name  Route  Sig  Dispense  Refill  . albuterol (PROVENTIL HFA;VENTOLIN HFA) 108 (90 BASE) MCG/ACT inhaler   Inhalation   Inhale 2 puffs into the lungs every 6 (six) hours as needed for wheezing or shortness of breath.         Marland Kitchen atorvastatin (LIPITOR) 40 MG tablet   Oral   Take 40 mg by mouth daily. PM         . cetirizine (ZYRTEC) 10 MG tablet   Oral   Take 10 mg by mouth daily. AM         . fluticasone (FLONASE) 50 MCG/ACT nasal spray   Each Nare   Place 2 sprays into both nostrils as needed.          . hydrochlorothiazide (HYDRODIURIL) 12.5 MG tablet   Oral   Take 12.5 mg by mouth daily. AM         .  methylphenidate (METADATE CD) 20 MG CR capsule   Oral   Take 20 mg by mouth every morning.         Marland Kitchen omeprazole (PRILOSEC) 20 MG capsule   Oral   Take 20 mg by mouth 2 (two) times daily before a meal.          . sulfamethoxazole-trimethoprim (BACTRIM DS,SEPTRA DS) 800-160 MG tablet   Oral   Take 1 tablet by mouth 2 (two) times daily.         . tamsulosin (FLOMAX) 0.4 MG CAPS capsule   Oral   Take 0.4 mg by mouth. EVE         . tiotropium (SPIRIVA) 18 MCG inhalation capsule   Inhalation   Place 18 mcg into inhaler and inhale daily. AM         . vitamin B-12 (CYANOCOBALAMIN) 1000 MCG tablet   Oral   Take 1,000 mcg by mouth daily.           Allergies Hydrocodone-homatropine  Family History  Problem Relation Age of Onset  . Colon cancer Father      Social History Social History  Substance Use Topics  . Smoking status: Former Smoker -- 2.00 packs/day for 15 years    Types: Cigarettes  . Smokeless tobacco: Never Used  . Alcohol Use: 1.2 oz/week    2 Glasses of wine per week    Review of Systems Constitutional: No fever/chills Eyes: No visual changes. ENT: No sore throat. Cardiovascular: Denies chest pain. Respiratory: Denies shortness of breath. Gastrointestinal: abdominal pain.  No nausea, no vomiting.  No diarrhea.  No constipation. Genitourinary: Inability to urinate Musculoskeletal: Negative for back pain. Skin: Negative for rash. Neurological: Negative for headaches, focal weakness or numbness.  10-point ROS otherwise negative.  ____________________________________________   PHYSICAL EXAM:  VITAL SIGNS: ED Triage Vitals  Enc Vitals Group     BP 10/07/15 0006 175/103 mmHg     Pulse Rate 10/07/15 0006 102     Resp 10/07/15 0006 20     Temp 10/07/15 0006 98.2 F (36.8 C)     Temp Source 10/07/15 0006 Oral     SpO2 10/07/15 0006 96 %     Weight --      Height --      Head Cir --      Peak Flow --      Pain Score 10/07/15 0007 10     Pain Loc --      Pain Edu? --      Excl. in [place]? --     Constitutional: Alert and oriented. Well appearing and in Moderate to severe distress Eyes: Conjunctivae are normal. PERRL. EOMI. Head: Atraumatic. Nose: No congestion/rhinnorhea. Mouth/Throat: Mucous membranes are moist.  Oropharynx non-erythematous. Cardiovascular: Normal rate, regular rhythm. Grossly normal heart sounds.  Good peripheral circulation. Respiratory: Normal respiratory effort.  No retractions. Lungs CTAB. Gastrointestinal: Soft with some lower abdominal tenderness to palpation,  distention. Positive bowel sounds Musculoskeletal: No lower extremity tenderness nor edema.   Neurologic:  Normal speech and language.  Skin:  Skin is warm, dry and intact.  Psychiatric: Mood and affect are normal.    ____________________________________________   LABS (all labs ordered are listed, but only abnormal results are displayed)  Labs Reviewed  CBC - Abnormal; Notable for the following:    WBC 12.9 (*)    All other components within normal limits  COMPREHENSIVE METABOLIC PANEL - Abnormal; Notable for the following:    Sodium 131 (*)  Chloride 97 (*)    Glucose, Bld 112 (*)    Creatinine, Ser 1.59 (*)    Total Protein 8.3 (*)    GFR calc non Af Amer 41 (*)    GFR calc Af Amer 48 (*)    All other components within normal limits  URINALYSIS COMPLETEWITH MICROSCOPIC ([facility] ONLY) - Abnormal; Notable for the following:    Color, Urine YELLOW (*)    APPearance CLEAR (*)    Bacteria, UA RARE (*)    All other components within normal limits   ____________________________________________  EKG  none ____________________________________________  RADIOLOGY  none ____________________________________________   PROCEDURES  Procedure(s) performed: None  Critical Care performed: No  ____________________________________________   INITIAL IMPRESSION / ASSESSMENT AND PLAN / ED COURSE  Pertinent labs & imaging results that were available during my care of the patient were reviewed by me and considered in my medical decision making (see chart for details).  This is a 75 year old male who comes in with some abdominal pain and urinary retention. The patient does have some abdominal distention and some inability to urinate. When we did do a bladder scan it appears that the patient has 621 mL in his bladder. The patient will have a Foley placed. We'll check a CBC and a CMP as well as a urinalysis.  The patient has some mild elevation of his creatinine but this likely from the urinary obstruction. The patient's urine is clear and his blood work is unremarkable. I will have the nurse which the patient's Foley to a leg bag and I will have him follow-up in the urologist's office. The patient  has no further questions or concerns at this time and he'll be discharged home. ____________________________________________   FINAL CLINICAL IMPRESSION(S) / ED DIAGNOSES  Final diagnoses:  Urinary retention      NEW MEDICATIONS STARTED DURING THIS VISIT:  New Prescriptions   No medications on file     Note:  This document was prepared using Dragon voice recognition software and may include unintentional dictation errors.    Loney Hering, MD 10/07/15 [phone]

## 2015-10-07 NOTE — ED Notes (Signed)
Pt presents via [facility] from home c/o of lower abdominal pain, pressure and not being able to void or defecate since Sunday. Pt states he went to PCP on Monday, they gave him an antibiotic, he took 5 pills, they called him back with test results and told him he did not have a UTI. Pt later told this RN he urinated at PCP for urine sample. Pt has hx of COPD, HTN, and elevated cholesterol per EMS.

## 2015-10-07 NOTE — Discharge Instructions (Signed)
Acute Urinary Retention, Male °Acute urinary retention is the temporary inability to urinate. °This is a common problem in older men. As men age their prostates become larger and block the flow of urine from the bladder. This is usually a problem that has come on gradually.  °HOME CARE INSTRUCTIONS °If you are sent home with a Foley catheter and a drainage system, you will need to discuss the best course of action with your health care provider. While the catheter is in, maintain a good intake of fluids. Keep the drainage bag emptied and lower than your catheter. This is so that contaminated urine will not flow back into your bladder, which could lead to a urinary tract infection. °There are two main types of drainage bags. One is a large bag that usually is used at night. It has a good capacity that will allow you to sleep through the night without having to empty it. The second type is called a leg bag. It has a smaller capacity, so it needs to be emptied more frequently. However, the main advantage is that it can be attached by a leg strap and can go underneath your clothing, allowing you the freedom to move about or leave your home. °Only take over-the-counter or prescription medicines for pain, discomfort, or fever as directed by your health care provider.  °SEEK MEDICAL CARE IF: °· You develop a low-grade fever. °· You experience spasms or leakage of urine with the spasms. °SEEK IMMEDIATE MEDICAL CARE IF:  °· You develop chills or fever. °· Your catheter stops draining urine. °· Your catheter falls out. °· You start to develop increased bleeding that does not respond to rest and increased fluid intake. °MAKE SURE YOU: °· Understand these instructions. °· Will watch your condition. °· Will get help right away if you are not doing well or get worse. °  °This information is not intended to replace advice given to you by your health care provider. Make sure you discuss any questions you have with your health care  provider. °  °Document Released: 07/23/2000 Document Revised: 08/31/2014 Document Reviewed: 09/25/2012 °Elsevier Interactive Patient Education ©2016 [employer] ° °

## 2015-10-07 NOTE — ED Notes (Signed)
Pt verbalized relief of pressure upon insertion of foley catheter. Pt states pain decreased after insertion complete.

## 2015-10-17 ENCOUNTER — Encounter: Payer: Self-pay | Admitting: Urology

## 2015-10-17 ENCOUNTER — Ambulatory Visit (INDEPENDENT_AMBULATORY_CARE_PROVIDER_SITE_OTHER): Payer: PPO | Admitting: Urology

## 2015-10-17 VITALS — BP 136/83 | HR 92 | Ht 68.0 in | Wt 183.7 lb

## 2015-10-17 DIAGNOSIS — N138 Other obstructive and reflux uropathy: Secondary | ICD-10-CM

## 2015-10-17 DIAGNOSIS — R338 Other retention of urine: Secondary | ICD-10-CM

## 2015-10-17 DIAGNOSIS — N401 Enlarged prostate with lower urinary tract symptoms: Secondary | ICD-10-CM

## 2015-10-17 MED ORDER — FINASTERIDE 5 MG PO TABS
5.0000 mg | ORAL_TABLET | Freq: Every day | ORAL | Status: DC
Start: 2015-10-17 — End: 2016-10-08

## 2015-10-17 NOTE — Progress Notes (Signed)
10/17/2015 9:04 AM   Arthur Owens Apr 28, 1941 [id number]  Referring provider: Sherrin Daisy, MD [place], [place] [id number]  Chief Complaint  Patient presents with  . Urinary Retention    referred by Charlesetta Ivory seen at ER  . Urinary Frequency    HPI: Patient is a 75 year old Caucasian male who presents to [place] as a referral from [facility] for acute urinary retention.  He states that June 5 he had started to experience difficulty with urination and constipation.  He was then seen by his primary care's office the next day and was prescribed an antibiotic for a possible UTI. He began the antibiotic, but his symptoms did not improve. On June 9 the pain became so severe that it became 10 out of 10. His belly started to swell and he was experiencing extreme pressure in his abdominal area. He was only able to dribble a small amount of urine. It was then that he was seen in the emergency room.  Bladder scan noted 621 mL in the bladder and had a Foley catheter placed.  Today, patient presents with Foley catheter in place. He states that he has been on tamsulosin 0.4 mg for several months. Prior to the urinary retention episode, he is experiencing urinary frequency and nocturia 4. His I PSS score is 17/4.  PSA was 3.06 on 03/01/2015.  He is not had any fevers, chills, nausea or vomiting. He states that the Foley catheter is quite irritating.      IPSS      10/17/15 0800       International Prostate Symptom Score   How often have you had the sensation of not emptying your bladder? Less than 1 in 5     How often have you had to urinate less than every two hours? About half the time     How often have you found you stopped and started again several times when you urinated? Less than half the time     How often have you found it difficult to postpone urination? About half the time     How often have you had a weak urinary stream? About half the time      How often have you had to strain to start urination? Less than half the time     How many times did you typically get up at night to urinate? 3 Times     Total IPSS Score 17     Quality of Life due to urinary symptoms   If you were to spend the rest of your life with your urinary condition just the way it is now how would you feel about that? Mostly Disatisfied        Score:  1-7 Mild 8-19 Moderate 20-35 Severe  He is quite anxious and is squeezing Play-Doh in his hand and has very pressured speech during the visit.  PMH: Past Medical History  Diagnosis Date  . Hyperlipidemia   . GERD (gastroesophageal reflux disease)   . COPD (chronic obstructive pulmonary disease) ([place])   . Bladder neck obstruction   . Shortness of breath dyspnea   . ADD (attention deficit disorder)   . Cancer ([place])     H/O ADENOMATOUS POLYP OF COLON  . Seasonal allergies   . Anxiety   . HTN (hypertension)     Surgical History: Past Surgical History  Procedure Laterality Date  . Adentamous colon polyp    . Left humerus debridement  Left   . Appendectomy    . Tonsillectomy    . Colonoscopy with propofol N/A 01/28/2015    Procedure: COLONOSCOPY WITH PROPOFOL;  Surgeon: Manya Silvas, MD;  Location: [facility] ENDOSCOPY;  Service: Endoscopy;  Laterality: N/A;  . Esophagogastroduodenoscopy (egd) with propofol N/A 01/28/2015    Procedure: ESOPHAGOGASTRODUODENOSCOPY (EGD) WITH PROPOFOL;  Surgeon: Manya Silvas, MD;  Location: [facility] ENDOSCOPY;  Service: Endoscopy;  Laterality: N/A;  . Hernia repair Left     LEFT INGUINAL  . Trigger finger release Left 04/15/2015    Procedure: LEFT RING FINGER DUPUYTREN RELEASE;  Surgeon: Leanor Kail, MD;  Location: [facility];  Service: Orthopedics;  Laterality: Left;    Home Medications:    Medication List       This list is accurate as of: 10/17/15  9:04 AM.  Always use your most recent med list.               albuterol 108 (90 Base) MCG/ACT inhaler   Commonly known as:  PROVENTIL HFA;VENTOLIN HFA  Inhale 2 puffs into the lungs every 6 (six) hours as needed for wheezing or shortness of breath.     atorvastatin 40 MG tablet  Commonly known as:  LIPITOR  Take 40 mg by mouth daily. PM     cetirizine 10 MG tablet  Commonly known as:  ZYRTEC  Take 10 mg by mouth daily. AM     finasteride 5 MG tablet  Commonly known as:  PROSCAR  Take 1 tablet (5 mg total) by mouth daily.     fluticasone 50 MCG/ACT nasal spray  Commonly known as:  FLONASE  Place 2 sprays into both nostrils as needed.     hydrochlorothiazide 12.5 MG tablet  Commonly known as:  HYDRODIURIL  Take 12.5 mg by mouth daily. AM     meloxicam 7.5 MG tablet  Commonly known as:  MOBIC  Reported on 10/17/2015     methylphenidate 10 MG tablet  Commonly known as:  RITALIN  Take 10 mg by mouth 2 (two) times daily.     omeprazole 20 MG capsule  Commonly known as:  PRILOSEC  Take 20 mg by mouth 2 (two) times daily before a meal.     tamsulosin 0.4 MG Caps capsule  Commonly known as:  FLOMAX  Take 0.4 mg by mouth. EVE     tiotropium 18 MCG inhalation capsule  Commonly known as:  SPIRIVA  Place 18 mcg into inhaler and inhale daily. AM     vitamin B-12 1000 MCG tablet  Commonly known as:  CYANOCOBALAMIN  Take 1,000 mcg by mouth daily.        Allergies:  Allergies  Allergen Reactions  . Hydrocodone-Homatropine Nausea And Vomiting    Family History: Family History  Problem Relation Age of Onset  . Colon cancer Father   . Stroke Mother   . Diabetes    . Hyperlipidemia    . Glaucoma    . Prostate cancer Father   . Kidney disease Neg Hx     Social History:  reports that he has quit smoking. His smoking use included Cigarettes. He has a 30 pack-year smoking history. He has never used smokeless tobacco. He reports that he drinks about 1.2 oz of alcohol per week. He reports that he does not use illicit drugs.  ROS: UROLOGY Frequent Urination?: No Hard to  postpone urination?: No Burning/pain with urination?: No Get up at night to urinate?: Yes Leakage of urine?: No  Urine stream starts and stops?: No Trouble starting stream?: No Do you have to strain to urinate?: No Blood in urine?: No Urinary tract infection?: No Sexually transmitted disease?: No Injury to kidneys or bladder?: No Painful intercourse?: No Weak stream?: No Erection problems?: No Penile pain?: No  Gastrointestinal Nausea?: No Vomiting?: No Indigestion/heartburn?: Yes Diarrhea?: No Constipation?: Yes  Constitutional Fever: No Night sweats?: No Weight loss?: Yes Fatigue?: Yes  Skin Skin rash/lesions?: No Itching?: No  Eyes Blurred vision?: No Double vision?: No  Ears/Nose/Throat Sore throat?: No Sinus problems?: Yes  Hematologic/Lymphatic Swollen glands?: No Easy bruising?: No  Cardiovascular Leg swelling?: No Chest pain?: No  Respiratory Cough?: No Shortness of breath?: No  Endocrine Excessive thirst?: No  Musculoskeletal Back pain?: No Joint pain?: No  Neurological Headaches?: No Dizziness?: No  Psychologic Depression?: No Anxiety?: No  Physical Exam: BP 136/83 mmHg  Pulse 92  Ht 5\' 8"  (1.727 m)  Wt 183 lb 11.2 oz (83.326 kg)  BMI 27.94 kg/m2  Constitutional: Well nourished. Alert and oriented, No acute distress. HEENT: [place] AT, moist mucus membranes. Trachea midline, no masses. Cardiovascular: No clubbing, cyanosis, or edema. Respiratory: Normal respiratory effort, no increased work of breathing. GI: Abdomen is soft, non tender, non distended, no abdominal masses. Liver and spleen not palpable.  No hernias appreciated.  Stool sample for occult testing is not indicated.   GU: No CVA tenderness.  No bladder fullness or masses.  Patient with circumcised phallus.   Urethral meatus is patent.  No penile discharge. No penile lesions or rashes. Scrotum without lesions, cysts, rashes and/or edema.  Testicles are located scrotally  bilaterally. No masses are appreciated in the testicles. Left and right epididymis are normal. Rectal: Patient with  normal sphincter tone. Anus and perineum without scarring or rashes. No rectal masses are appreciated. Prostate is approximately 60 grams, irregular, no nodules are appreciated. Seminal vesicles are normal. Skin: No rashes, bruises or suspicious lesions. Lymph: No cervical or inguinal adenopathy. Neurologic: Grossly intact, no focal deficits, moving all 4 extremities. Psychiatric: Normal mood and affect.  Laboratory Data: Lab Results  Component Value Date   WBC 12.9* 10/07/2015   HGB 15.9 10/07/2015   HCT 45.6 10/07/2015   MCV 92.4 10/07/2015   PLT 270 10/07/2015    Lab Results  Component Value Date   CREATININE 1.59* 10/07/2015    Lab Results  Component Value Date   AST 41 10/07/2015   Lab Results  Component Value Date   ALT 42 10/07/2015   Catheter Removal  Patient is present today for a catheter removal.  10 ml of water was drained from the balloon. A 16 FR foley cath was removed from the bladder no complications were noted . Patient tolerated well.  Preformed by: Zara Council, PA-C  Assessment & Plan:    1. Acute urinary retention:      - foley catheter removed   - voiding trial today     - return if unable to urinate or experiencing suprapubic discomfort   - follow-up in one month for I PSS score and  PVR   2. BPH with LUTS  - IPSS score is 17/4  - Initiate 5 alpha reductase inhibitor (finasteride), discussed side effects  - Continue tamsulosin 0.4 mg daily  - RTC in one month for IPSS and PVR  Return in about 1 month (around 11/16/2015) for IPSS and PVR.  These notes generated with voice recognition software. I apologize for typographical errors.  Emit Kuenzel, PA-C  [facility] [address], [place], [place] [address] [phone]

## 2015-10-17 NOTE — Patient Instructions (Addendum)
Acute Urinary Retention, Male Acute urinary retention is the temporary inability to urinate. This is a common problem in older men. As men age their prostates become larger and block the flow of urine from the bladder. This is usually a problem that has come on gradually.  HOME CARE INSTRUCTIONS If you are sent home with a Foley catheter and a drainage system, you will need to discuss the best course of action with your health care provider. While the catheter is in, maintain a good intake of fluids. Keep the drainage bag emptied and lower than your catheter. This is so that contaminated urine will not flow back into your bladder, which could lead to a urinary tract infection. There are two main types of drainage bags. One is a large bag that usually is used at night. It has a good capacity that will allow you to sleep through the night without having to empty it. The second type is called a leg bag. It has a smaller capacity, so it needs to be emptied more frequently. However, the main advantage is that it can be attached by a leg strap and can go underneath your clothing, allowing you the freedom to move about or leave your home. Only take over-the-counter or prescription medicines for pain, discomfort, or fever as directed by your health care provider.  SEEK MEDICAL CARE IF:  You develop a low-grade fever.  You experience spasms or leakage of urine with the spasms. SEEK IMMEDIATE MEDICAL CARE IF:   You develop chills or fever.  Your catheter stops draining urine.  Your catheter falls out.  You start to develop increased bleeding that does not respond to rest and increased fluid intake. MAKE SURE YOU:  Understand these instructions.  Will watch your condition.  Will get help right away if you are not doing well or get worse.   This information is not intended to replace advice given to you by your health care provider. Make sure you discuss any questions you have with your health care  provider.   Document Released: 07/23/2000 Document Revised: 08/31/2014 Document Reviewed: 09/25/2012 Elsevier Interactive Patient Education 2016 Elsevier Inc. Finasteride (Proscar) tablets What is this medicine? FINASTERIDE (fi NAS teer ide) is used to treat benign prostatic hyperplasia (BPH) in men. This is a condition that causes you to have an enlarged prostate. This medicine helps to control your symptoms, decrease urinary retention, and reduces your risk of needing surgery. When used in combination with certain other medicines, this drug can slow down the progression of your disease. This medicine may be used for other purposes; ask your health care provider or pharmacist if you have questions. What should I tell my health care provider before I take this medicine? They need to know if you have any of these conditions: -liver disease -an unusual or allergic reaction to finasteride, other medicines, foods, dyes, or preservatives -pregnant or trying to get pregnant -breast-feeding How should I use this medicine? Take this medicine by mouth with a glass of water. Follow the directions on the prescription label. You can take this medicine with or without food. Take your doses at regular intervals. Do not take your medicine more often than directed. Do not stop taking except on the advice of your doctor or health care professional. Talk to your pediatrician regarding the use of this medicine in children. Special care may be needed. Overdosage: If you think you have taken too much of this medicine contact a poison control center or emergency  room at once. NOTE: This medicine is only for you. Do not share this medicine with others. What if I miss a dose? If you miss a dose, take it as soon as you can. If it is almost time for your next dose, take only that dose. Do not take double or extra doses. What may interact with this medicine? -saw palmetto or other dietary supplements This list may not  describe all possible interactions. Give your health care provider a list of all the medicines, herbs, non-prescription drugs, or dietary supplements you use. Also tell them if you smoke, drink alcohol, or use illegal drugs. Some items may interact with your medicine. What should I watch for while using this medicine? Do not donate blood while you are taking this medicine. This will prevent giving this medicine to a pregnant male through a blood transfusion. Ask your doctor or health care professional when it is safe to donate blood after you stop taking this medicine. Women who are pregnant or may get pregnant must not handle broken or crushed finasteride tablets. The active ingredient could harm the unborn baby. If a pregnant woman comes into contact with broken or crushed tablets she should check with her doctor or health care professional. Exposure to whole tablets is not expected to cause harm as long as they are not swallowed. Contact your doctor or health care professional if your symptoms do not start to get better. You may need to take this medicine for 6 to 12 months to get the best results. This medicine can interfere with PSA laboratory tests for prostate cancer. If you are scheduled to have a lab test for prostate cancer, tell your doctor or health care professional that you are taking this medicine. This medicine may increase your risk of getting some cancers, like breast cancer. Talk with your doctor. What side effects may I notice from receiving this medicine? Side effects that you should report to your doctor or health care professional as soon as possible: -any signs of an allergic reaction like rash, itching, hives or swelling of the lips or face -changes in breast like lumps, pain or fluids leaking from the nipple -pain in the testicles Side effects that usually do not require medical attention (report to your doctor or health care professional if they continue or are  bothersome): -sexual difficulties like decreased sexual desire or ability to get an erection -small amount of semen released during sex This list may not describe all possible side effects. Call your doctor for medical advice about side effects. You may report side effects to FDA at 1-800-FDA-1088. Where should I keep my medicine? Keep out of the reach of children. Store at room temperature below 30 degrees C (86 degrees F). Protect from light. Keep container tightly closed. Throw away any unused medicine after the expiration date. NOTE: This sheet is a summary. It may not cover all possible information. If you have questions about this medicine, talk to your doctor, pharmacist, or health care provider.    2016, Elsevier/Gold Standard. (2014-12-02 17:24:30)  Patient to encouraged to drink fluids and to return to the clinic by 3 pm if difficulty voiding.

## 2015-10-19 ENCOUNTER — Ambulatory Visit: Payer: PPO | Admitting: Urology

## 2015-10-19 ENCOUNTER — Ambulatory Visit (INDEPENDENT_AMBULATORY_CARE_PROVIDER_SITE_OTHER): Payer: PPO | Admitting: Urology

## 2015-10-19 ENCOUNTER — Telehealth: Payer: Self-pay | Admitting: Urology

## 2015-10-19 DIAGNOSIS — N401 Enlarged prostate with lower urinary tract symptoms: Secondary | ICD-10-CM

## 2015-10-19 DIAGNOSIS — R339 Retention of urine, unspecified: Secondary | ICD-10-CM

## 2015-10-19 DIAGNOSIS — N138 Other obstructive and reflux uropathy: Secondary | ICD-10-CM

## 2015-10-19 LAB — BLADDER SCAN AMB NON-IMAGING: Scan Result: 567

## 2015-10-19 NOTE — Progress Notes (Signed)
In and Out Catheterization  Patient is present today for a I & O catheterization due to urinary retention. Patient was cleaned and prepped in a sterile fashion with betadine and Lidocaine 2% jelly was instilled into the urethra.  A 14FR cath was inserted no complications were noted , 369ml of urine return was noted, urine was clear and yellow in color.  Bladder was drained  and catheter was removed with out difficulty.    Preformed by: Lyndee Hensen CMA  Follow up/ Additional notes: One Week.Chelsea to send form to [employer] for cath supplies. Patient to cath 4 times per day.

## 2015-10-19 NOTE — Telephone Encounter (Signed)
Patient is having pain when he urinates and he is getting up every hour to go to the bathroom. This started last night about 5 o'clock and he would like a phone call back to discuss this as soon as you can   Thanks,  Sharyn Lull

## 2015-10-19 NOTE — Telephone Encounter (Signed)
Patient coming now for bladder scan.

## 2015-10-20 DIAGNOSIS — N400 Enlarged prostate without lower urinary tract symptoms: Secondary | ICD-10-CM | POA: Insufficient documentation

## 2015-10-20 DIAGNOSIS — N138 Other obstructive and reflux uropathy: Secondary | ICD-10-CM | POA: Insufficient documentation

## 2015-10-20 DIAGNOSIS — N401 Enlarged prostate with lower urinary tract symptoms: Secondary | ICD-10-CM

## 2015-10-24 ENCOUNTER — Encounter: Payer: Self-pay | Admitting: Urology

## 2015-10-24 NOTE — Progress Notes (Signed)
1:08 PM   Arthur Owens 09/11/1940 [id number]  Referring provider: Sherrin Daisy, MD [place], [place] [id number]  Chief Complaint  Patient presents with  . Urinary Retention    HPI: Patient is a 75 year old Caucasian male who had his Foley catheter removed 2 days prior for a voiding trial presents today stating he is having difficulty voiding.  Background history Patient presented to [place] as a referral from [facility] for acute urinary retention.  He states that June 5 he had started to experience difficulty with urination and constipation.  He was then seen by his primary care's office the next day and was prescribed an antibiotic for a possible UTI. He began the antibiotic, but his symptoms did not improve. On June 9 the pain became so severe that it became 10 out of 10. His belly started to swell and he was experiencing extreme pressure in his abdominal area. He was only able to dribble a small amount of urine. It was then that he was seen in the emergency room.  Bladder scan noted 621 mL in the bladder and had a Foley catheter placed.  He states that he has been on tamsulosin 0.4 mg for several months. Prior to the urinary retention episode, he is experiencing urinary frequency and nocturia 4. His I PSS score is 17/4.  PSA was 3.06 on 03/01/2015.  He is not had any fevers, chills, nausea or vomiting. He states that the Foley catheter is quite irritating.  Foley catheter was removed on 10/17/2015 without difficulty to the patient. He was instructed to return home and increase his fluid and return to the office if he was unable to void or felt that he was having difficulty emptying his bladder.  He was to continue the tamsulosin 0.4 mg daily and finasteride 5 mg daily was initiated.  Patient contact the office on the afternoon of 10/19/2015 stating that he was having pain when he urinated and was getting up every hour to use the restroom. He stated  that it started the night before in the evening. He is not having gross hematuria, but admits to suprapubic pain. He is not having fevers, chills, nausea or vomiting.  He presented to the office and he was quite anxious. His PVR was found to be 167 mL.        IPSS      10/17/15 0800       International Prostate Symptom Score   How often have you had the sensation of not emptying your bladder? Less than 1 in 5     How often have you had to urinate less than every two hours? About half the time     How often have you found you stopped and started again several times when you urinated? Less than half the time     How often have you found it difficult to postpone urination? About half the time     How often have you had a weak urinary stream? About half the time     How often have you had to strain to start urination? Less than half the time     How many times did you typically get up at night to urinate? 3 Times     Total IPSS Score 17     Quality of Life due to urinary symptoms   If you were to spend the rest of your life with your urinary condition just the way it is  now how would you feel about that? Mostly Disatisfied        Score:  1-7 Mild 8-19 Moderate 20-35 Severe  He is quite anxious and is squeezing Play-Doh in his hand and has very pressured speech during the visit.  PMH: Past Medical History  Diagnosis Date  . Hyperlipidemia   . GERD (gastroesophageal reflux disease)   . COPD (chronic obstructive pulmonary disease) ([place])   . Bladder neck obstruction   . Shortness of breath dyspnea   . ADD (attention deficit disorder)   . Cancer ([place])     H/O ADENOMATOUS POLYP OF COLON  . Seasonal allergies   . Anxiety   . HTN (hypertension)     Surgical History: Past Surgical History  Procedure Laterality Date  . Adentamous colon polyp    . Left humerus debridement Left   . Appendectomy    . Tonsillectomy    . Colonoscopy with propofol N/A 01/28/2015    Procedure:  COLONOSCOPY WITH PROPOFOL;  Surgeon: Manya Silvas, MD;  Location: [facility] ENDOSCOPY;  Service: Endoscopy;  Laterality: N/A;  . Esophagogastroduodenoscopy (egd) with propofol N/A 01/28/2015    Procedure: ESOPHAGOGASTRODUODENOSCOPY (EGD) WITH PROPOFOL;  Surgeon: Manya Silvas, MD;  Location: [facility] ENDOSCOPY;  Service: Endoscopy;  Laterality: N/A;  . Hernia repair Left     LEFT INGUINAL  . Trigger finger release Left 04/15/2015    Procedure: LEFT RING FINGER DUPUYTREN RELEASE;  Surgeon: Leanor Kail, MD;  Location: [place];  Service: Orthopedics;  Laterality: Left;    Home Medications:    Medication List       This list is accurate as of: 10/19/15 11:59 PM.  Always use your most recent med list.               albuterol 108 (90 Base) MCG/ACT inhaler  Commonly known as:  PROVENTIL HFA;VENTOLIN HFA  Inhale 2 puffs into the lungs every 6 (six) hours as needed for wheezing or shortness of breath.     atorvastatin 40 MG tablet  Commonly known as:  LIPITOR  Take 40 mg by mouth daily. PM     cetirizine 10 MG tablet  Commonly known as:  ZYRTEC  Take 10 mg by mouth daily. AM     finasteride 5 MG tablet  Commonly known as:  PROSCAR  Take 1 tablet (5 mg total) by mouth daily.     fluticasone 50 MCG/ACT nasal spray  Commonly known as:  FLONASE  Place 2 sprays into both nostrils as needed.     hydrochlorothiazide 12.5 MG tablet  Commonly known as:  HYDRODIURIL  Take 12.5 mg by mouth daily. AM     meloxicam 7.5 MG tablet  Commonly known as:  MOBIC  Reported on 10/17/2015     methylphenidate 10 MG tablet  Commonly known as:  RITALIN  Take 10 mg by mouth 2 (two) times daily.     omeprazole 20 MG capsule  Commonly known as:  PRILOSEC  Take 20 mg by mouth 2 (two) times daily before a meal.     tamsulosin 0.4 MG Caps capsule  Commonly known as:  FLOMAX  Take 0.4 mg by mouth. EVE     tiotropium 18 MCG inhalation capsule  Commonly known as:  SPIRIVA  Place 18 mcg  into inhaler and inhale daily. AM     vitamin B-12 1000 MCG tablet  Commonly known as:  CYANOCOBALAMIN  Take 1,000 mcg by mouth daily.  Allergies:  Allergies  Allergen Reactions  . Hydrocodone-Homatropine Nausea And Vomiting    Family History: Family History  Problem Relation Age of Onset  . Colon cancer Father   . Stroke Mother   . Diabetes    . Hyperlipidemia    . Glaucoma    . Prostate cancer Father   . Kidney disease Neg Hx     Social History:  reports that he has quit smoking. His smoking use included Cigarettes. He has a 30 pack-year smoking history. He has never used smokeless tobacco. He reports that he drinks about 1.2 oz of alcohol per week. He reports that he does not use illicit drugs.  ROS: UROLOGY Frequent Urination?: Yes Hard to postpone urination?: Yes Burning/pain with urination?: Yes Get up at night to urinate?: Yes Leakage of urine?: No Urine stream starts and stops?: Yes Trouble starting stream?: Yes Do you have to strain to urinate?: Yes Blood in urine?: No Urinary tract infection?: No Sexually transmitted disease?: No Injury to kidneys or bladder?: No Painful intercourse?: No Weak stream?: Yes Erection problems?: No Penile pain?: No  Gastrointestinal Nausea?: No Vomiting?: No Indigestion/heartburn?: Yes Diarrhea?: No Constipation?: Yes  Constitutional Fever: No Night sweats?: No Weight loss?: Yes Fatigue?: Yes  Skin Skin rash/lesions?: No Itching?: No  Eyes Blurred vision?: No Double vision?: No  Ears/Nose/Throat Sore throat?: No Sinus problems?: Yes  Hematologic/Lymphatic Swollen glands?: No Easy bruising?: No  Cardiovascular Leg swelling?: No Chest pain?: No  Respiratory Cough?: No Shortness of breath?: No  Endocrine Excessive thirst?: No  Musculoskeletal Back pain?: No Joint pain?: No  Neurological Headaches?: No Dizziness?: No  Psychologic Depression?: No Anxiety?: Yes  Physical  Exam: There were no vitals taken for this visit.  Constitutional: Well nourished. Alert and oriented, No acute distress. HEENT: [place] AT, moist mucus membranes. Trachea midline, no masses. Cardiovascular: No clubbing, cyanosis, or edema. Respiratory: Normal respiratory effort, no increased work of breathing. GI: Abdomen is soft, non tender, non distended, no abdominal masses. Liver and spleen not palpable.  No hernias appreciated.  Stool sample for occult testing is not indicated.   GU: No CVA tenderness.  No bladder is palpable.   Rectal: Deferred.   Skin: No rashes, bruises or suspicious lesions. Lymph: No cervical or inguinal adenopathy. Neurologic: Grossly intact, no focal deficits, moving all 4 extremities. Psychiatric: Normal mood and affect.  Laboratory Data: Lab Results  Component Value Date   WBC 12.9* 10/07/2015   HGB 15.9 10/07/2015   HCT 45.6 10/07/2015   MCV 92.4 10/07/2015   PLT 270 10/07/2015    Lab Results  Component Value Date   CREATININE 1.59* 10/07/2015    Lab Results  Component Value Date   AST 41 10/07/2015   Lab Results  Component Value Date   ALT 42 10/07/2015    Assessment & Plan:    1. Acute urinary retention:      - patient is instructed on CIC, wife is a LPN and is comfortable with the technique    -return if unable to urinate or experiencing suprapubic discomfort   - follow-up in one month for I PSS score and  PVR   2. BPH with LUTS  - IPSS score is 17/4  - continue 5 alpha reductase inhibitor (finasteride)  - Continue tamsulosin 0.4 mg daily  - RTC in one month for IPSS and PVR  Return in about 1 month (around 11/18/2015) for IPSS and PVR.  These notes generated with voice recognition software. I apologize  for typographical errors.  Zara Council, [facility] [address] [place],  [address] [phone]

## 2015-10-27 ENCOUNTER — Ambulatory Visit (INDEPENDENT_AMBULATORY_CARE_PROVIDER_SITE_OTHER): Payer: PPO | Admitting: Urology

## 2015-10-27 VITALS — BP 143/79 | HR 108 | Ht 68.0 in | Wt 183.0 lb

## 2015-10-27 DIAGNOSIS — N401 Enlarged prostate with lower urinary tract symptoms: Secondary | ICD-10-CM

## 2015-10-27 DIAGNOSIS — R339 Retention of urine, unspecified: Secondary | ICD-10-CM

## 2015-10-27 DIAGNOSIS — N138 Other obstructive and reflux uropathy: Secondary | ICD-10-CM

## 2015-10-27 LAB — BLADDER SCAN AMB NON-IMAGING

## 2015-10-29 ENCOUNTER — Encounter: Payer: Self-pay | Admitting: Urology

## 2015-10-29 NOTE — Progress Notes (Signed)
11:29 PM   Arthur Owens 28-Jun-1940 [id number]  Referring provider: Sherrin Daisy, MD [place], [place] [id number]  Chief Complaint  Patient presents with  . Urinary Frequency    follow up    HPI: Patient is a 75 year old Caucasian male who had his Foley catheter removed last week and failed his voiding trial and is now doing CIC presents today to discuss his progress.   Background history Patient presented to [place] as a referral from [facility] for acute urinary retention.  He states that June 5 he had started to experience difficulty with urination and constipation.  He was then seen by his primary care's office the next day and was prescribed an antibiotic for a possible UTI. He began the antibiotic, but his symptoms did not improve. On June 9 the pain became so severe that it became 10 out of 10. His belly started to swell and he was experiencing extreme pressure in his abdominal area. He was only able to dribble a small amount of urine. It was then that he was seen in the emergency room.  Bladder scan noted 621 mL in the bladder and had a Foley catheter placed.  He states that he has been on tamsulosin 0.4 mg for several months. Prior to the urinary retention episode, he is experiencing urinary frequency and nocturia 4. His I PSS score is 17/4.  PSA was 3.06 on 03/01/2015.  He is not had any fevers, chills, nausea or vomiting. He states that the Foley catheter is quite irritating. Foley catheter was removed on 10/17/2015 without difficulty to the patient. He was instructed to return home and increase his fluid and return to the office if he was unable to void or felt that he was having difficulty emptying his bladder.  He was to continue the tamsulosin 0.4 mg daily and finasteride 5 mg daily was initiated. Patient contact the office on the afternoon of 10/19/2015 stating that he was having pain when he urinated and was getting up every hour to use the  restroom. He stated that it started the night before in the evening. He is not having gross hematuria, but admits to suprapubic pain. He is not having fevers, chills, nausea or vomiting.  He presented to the office and he was quite anxious. His PVR was found to be 167 mL.  He was instructed on CIC.  Today, he states he is still experiencing frequency of urination and nocturia. He states he is urinating approximately every hour and half both day and night.  He is only cathing himself twice daily.  His PVR today is 361 mL.  he is not experiencing dysuria, gross hematuria or suprapubic pain. He is not having fevers, chills, nausea or vomiting.       IPSS      10/17/15 0800       International Prostate Symptom Score   How often have you had the sensation of not emptying your bladder? Less than 1 in 5     How often have you had to urinate less than every two hours? About half the time     How often have you found you stopped and started again several times when you urinated? Less than half the time     How often have you found it difficult to postpone urination? About half the time     How often have you had a weak urinary stream? About half the time  How often have you had to strain to start urination? Less than half the time     How many times did you typically get up at night to urinate? 3 Times     Total IPSS Score 17     Quality of Life due to urinary symptoms   If you were to spend the rest of your life with your urinary condition just the way it is now how would you feel about that? Mostly Disatisfied        Score:  1-7 Mild 8-19 Moderate 20-35 Severe    PMH: Past Medical History  Diagnosis Date  . Hyperlipidemia   . GERD (gastroesophageal reflux disease)   . COPD (chronic obstructive pulmonary disease) ([place])   . Bladder neck obstruction   . Shortness of breath dyspnea   . ADD (attention deficit disorder)   . Cancer ([place])     H/O ADENOMATOUS POLYP OF COLON  . Seasonal  allergies   . Anxiety   . HTN (hypertension)     Surgical History: Past Surgical History  Procedure Laterality Date  . Adentamous colon polyp    . Left humerus debridement Left   . Appendectomy    . Tonsillectomy    . Colonoscopy with propofol N/A 01/28/2015    Procedure: COLONOSCOPY WITH PROPOFOL;  Surgeon: Manya Silvas, MD;  Location[facility] ENDOSCOPY;  Service: Endoscopy;  Laterality: N/A;  . Esophagogastroduodenoscopy (egd) with propofol N/A 01/28/2015    Procedure: ESOPHAGOGASTRODUODENOSCOPY (EGD) WITH PROPOFOL;  Surgeon: Manya Silvas, MD;  Location: [facility] ENDOSCOPY;  Service: Endoscopy;  Laterality: N/A;  . Hernia repair Left     LEFT INGUINAL  . Trigger finger release Left 04/15/2015    Procedure: LEFT RING FINGER DUPUYTREN RELEASE;  Surgeon: Leanor Kail, MD;  Location: [place];  Service: Orthopedics;  Laterality: Left;    Home Medications:    Medication List       This list is accurate as of: 10/27/15 11:59 PM.  Always use your most recent med list.               albuterol 108 (90 Base) MCG/ACT inhaler  Commonly known as:  PROVENTIL HFA;VENTOLIN HFA  Inhale 2 puffs into the lungs every 6 (six) hours as needed for wheezing or shortness of breath.     atorvastatin 40 MG tablet  Commonly known as:  LIPITOR  Take 40 mg by mouth daily. PM     cetirizine 10 MG tablet  Commonly known as:  ZYRTEC  Take 10 mg by mouth daily. AM     finasteride 5 MG tablet  Commonly known as:  PROSCAR  Take 1 tablet (5 mg total) by mouth daily.     fluticasone 50 MCG/ACT nasal spray  Commonly known as:  FLONASE  Place 2 sprays into both nostrils as needed.     hydrochlorothiazide 12.5 MG tablet  Commonly known as:  HYDRODIURIL  Take 12.5 mg by mouth daily. AM     meloxicam 7.5 MG tablet  Commonly known as:  MOBIC  Reported on 10/17/2015     methylphenidate 10 MG tablet  Commonly known as:  RITALIN  Take 10 mg by mouth 2 (two) times daily.     omeprazole  20 MG capsule  Commonly known as:  PRILOSEC  Take 20 mg by mouth 2 (two) times daily before a meal.     tamsulosin 0.4 MG Caps capsule  Commonly known as:  FLOMAX  Take 0.4 mg by  mouth. EVE     tiotropium 18 MCG inhalation capsule  Commonly known as:  SPIRIVA  Place 18 mcg into inhaler and inhale daily. AM     vitamin B-12 1000 MCG tablet  Commonly known as:  CYANOCOBALAMIN  Take 1,000 mcg by mouth daily.        Allergies:  Allergies  Allergen Reactions  . Hydrocodone-Homatropine Nausea And Vomiting    Family History: Family History  Problem Relation Age of Onset  . Colon cancer Father   . Stroke Mother   . Diabetes    . Hyperlipidemia    . Glaucoma    . Prostate cancer Father   . Kidney disease Neg Hx     Social History:  reports that he has quit smoking. His smoking use included Cigarettes. He has a 30 pack-year smoking history. He has never used smokeless tobacco. He reports that he drinks about 1.2 oz of alcohol per week. He reports that he does not use illicit drugs.  ROS: UROLOGY Frequent Urination?: Yes Hard to postpone urination?: No Burning/pain with urination?: No Get up at night to urinate?: Yes Leakage of urine?: No Urine stream starts and stops?: Yes Trouble starting stream?: No Do you have to strain to urinate?: No Blood in urine?: No Urinary tract infection?: No Sexually transmitted disease?: No Injury to kidneys or bladder?: No Painful intercourse?: No Weak stream?: No Erection problems?: No Penile pain?: No  Gastrointestinal Nausea?: No Vomiting?: No Indigestion/heartburn?: No Diarrhea?: No Constipation?: No  Constitutional Fever: No Night sweats?: No Weight loss?: No Fatigue?: No  Skin Skin rash/lesions?: No Itching?: No  Eyes Blurred vision?: No Double vision?: No  Ears/Nose/Throat Sore throat?: No Sinus problems?: No  Hematologic/Lymphatic Swollen glands?: No Easy bruising?: No  Cardiovascular Leg swelling?:  No Chest pain?: No  Respiratory Cough?: No Shortness of breath?: Yes  Endocrine Excessive thirst?: No  Musculoskeletal Back pain?: No Joint pain?: No  Neurological Headaches?: No Dizziness?: No  Psychologic Depression?: No Anxiety?: No  Physical Exam: BP 143/79 mmHg  Pulse 108  Ht 5\' 8"  (1.727 m)  Wt 183 lb (83.008 kg)  BMI 27.83 kg/m2  Constitutional: Well nourished. Alert and oriented, No acute distress. HEENT: [place] AT, moist mucus membranes. Trachea midline, no masses. Cardiovascular: No clubbing, cyanosis, or edema. Respiratory: Normal respiratory effort, no increased work of breathing. GI: Abdomen is soft, non tender, non distended, no abdominal masses. Liver and spleen not palpable.  No hernias appreciated.  Stool sample for occult testing is not indicated.   GU: No CVA tenderness.  No bladder is palpable.   Rectal: Deferred.   Skin: No rashes, bruises or suspicious lesions. Lymph: No cervical or inguinal adenopathy. Neurologic: Grossly intact, no focal deficits, moving all 4 extremities. Psychiatric: Normal mood and affect.  Laboratory Data: Lab Results  Component Value Date   WBC 12.9* 10/07/2015   HGB 15.9 10/07/2015   HCT 45.6 10/07/2015   MCV 92.4 10/07/2015   PLT 270 10/07/2015    Lab Results  Component Value Date   CREATININE 1.59* 10/07/2015    Lab Results  Component Value Date   AST 41 10/07/2015   Lab Results  Component Value Date   ALT 42 10/07/2015    Assessment & Plan:    1. Urinary retention:      - patient is instructed on CIC, wife is a LPN and is comfortable with the technique    -explained to the patient that he needs to CIC more often to keep  his residuals low   - follow-up in one month for I PSS score and  PVR   2. BPH with LUTS  - IPSS score is 17/4  - continue 5 alpha reductase inhibitor (finasteride)  - continue tamsulosin 0.4 mg daily  - RTC in one month for IPSS and PVR  Return for keep follow up appointment  on the 19th.  These notes generated with voice recognition software. I apologize for typographical errors.  Zara Council, [facility] [address], [place], [place] [address] [phone]

## 2015-11-07 ENCOUNTER — Telehealth: Payer: Self-pay

## 2015-11-07 NOTE — Telephone Encounter (Signed)
Joe from [facility] called stating pt has refused to accept ordered CIC supplies. Joe stated he would f/u with pt next month and try again.

## 2015-11-16 ENCOUNTER — Ambulatory Visit (INDEPENDENT_AMBULATORY_CARE_PROVIDER_SITE_OTHER): Payer: PPO | Admitting: Urology

## 2015-11-16 ENCOUNTER — Encounter: Payer: Self-pay | Admitting: Urology

## 2015-11-16 VITALS — BP 130/86 | Ht 68.0 in | Wt 184.5 lb

## 2015-11-16 DIAGNOSIS — Z125 Encounter for screening for malignant neoplasm of prostate: Secondary | ICD-10-CM | POA: Diagnosis not present

## 2015-11-16 DIAGNOSIS — R339 Retention of urine, unspecified: Secondary | ICD-10-CM | POA: Diagnosis not present

## 2015-11-16 DIAGNOSIS — N401 Enlarged prostate with lower urinary tract symptoms: Secondary | ICD-10-CM | POA: Diagnosis not present

## 2015-11-16 DIAGNOSIS — N138 Other obstructive and reflux uropathy: Secondary | ICD-10-CM

## 2015-11-16 LAB — BLADDER SCAN AMB NON-IMAGING: Scan Result: 169

## 2015-11-16 NOTE — Progress Notes (Signed)
9:22 AM   Arthur Owens Oct 01, 1940 [id number]  Referring provider: Sherrin Daisy, MD [place], [place] [id number]  Chief Complaint  Patient presents with  . Follow-up    urinary retention    HPI: Patient is a 75 year old Caucasian male who presents today for a recheck due to urinary retention.    Background history Patient presented to [place] as a referral from [facility] for acute urinary retention.  He states that June 5 he had started to experience difficulty with urination and constipation.  He was then seen by his primary care's office the next day and was prescribed an antibiotic for a possible UTI. He began the antibiotic, but his symptoms did not improve. On June 9 the pain became so severe that it became 10 out of 10. His belly started to swell and he was experiencing extreme pressure in his abdominal area. He was only able to dribble a small amount of urine. It was then that he was seen in the emergency room.  Bladder scan noted 621 mL in the bladder and had a Foley catheter placed.  He states that he has been on tamsulosin 0.4 mg for several months. Prior to the urinary retention episode, he is experiencing urinary frequency and nocturia 4. His I PSS score is 17/4.  PSA was 3.06 on 03/01/2015.  He is not had any fevers, chills, nausea or vomiting. He states that the Foley catheter is quite irritating. Foley catheter was removed on 10/17/2015 without difficulty to the patient. He was instructed to return home and increase his fluid and return to the office if he was unable to void or felt that he was having difficulty emptying his bladder.  He was to continue the tamsulosin 0.4 mg daily and finasteride 5 mg daily was initiated. Patient contact the office on the afternoon of 10/19/2015 stating that he was having pain when he urinated and was getting up every hour to use the restroom. He stated that it started the night before in the evening. He is  not having gross hematuria, but admits to suprapubic pain. He is not having fevers, chills, nausea or vomiting.  He presented to the office and he was quite anxious. His PVR was found to be 167 mL.  He was instructed on CIC.  Today, he states he has nocturia x 2.  He is not cathing.  His PVR today is 169 mL.  He is not experiencing dysuria, gross hematuria or suprapubic pain. He is not having fevers, chills, nausea or vomiting.  His IPSS score is 10/1.  He is continuing to take the tamsulosin and finasteride.        IPSS      10/17/15 0800 11/16/15 0800     International Prostate Symptom Score   How often have you had the sensation of not emptying your bladder? Less than 1 in 5 Less than 1 in 5    How often have you had to urinate less than every two hours? About half the time About half the time    How often have you found you stopped and started again several times when you urinated? Less than half the time Not at All    How often have you found it difficult to postpone urination? About half the time Less than 1 in 5 times    How often have you had a weak urinary stream? About half the time Less than half the time  How often have you had to strain to start urination? Less than half the time Not at All    How many times did you typically get up at night to urinate? 3 Times 3 Times    Total IPSS Score 17 10    Quality of Life due to urinary symptoms   If you were to spend the rest of your life with your urinary condition just the way it is now how would you feel about that? Mostly Disatisfied Pleased       Score:  1-7 Mild 8-19 Moderate 20-35 Severe    PMH: Past Medical History  Diagnosis Date  . Hyperlipidemia   . GERD (gastroesophageal reflux disease)   . COPD (chronic obstructive pulmonary disease) ([place])   . Bladder neck obstruction   . Shortness of breath dyspnea   . ADD (attention deficit disorder)   . Cancer ([place])     H/O ADENOMATOUS POLYP OF COLON  . Seasonal  allergies   . Anxiety   . HTN (hypertension)     Surgical History: Past Surgical History  Procedure Laterality Date  . Adentamous colon polyp    . Left humerus debridement Left   . Appendectomy    . Tonsillectomy    . Colonoscopy with propofol N/A 01/28/2015    Procedure: COLONOSCOPY WITH PROPOFOL;  Surgeon: Manya Silvas, MD;  Location: [facility] ENDOSCOPY;  Service: Endoscopy;  Laterality: N/A;  . Esophagogastroduodenoscopy (egd) with propofol N/A 01/28/2015    Procedure: ESOPHAGOGASTRODUODENOSCOPY (EGD) WITH PROPOFOL;  Surgeon: Manya Silvas, MD;  Location: [facility] ENDOSCOPY;  Service: Endoscopy;  Laterality: N/A;  . Hernia repair Left     LEFT INGUINAL  . Trigger finger release Left 04/15/2015    Procedure: LEFT RING FINGER DUPUYTREN RELEASE;  Surgeon: Leanor Kail, MD;  Location: [facility];  Service: Orthopedics;  Laterality: Left;    Home Medications:    Medication List       This list is accurate as of: 11/16/15  9:22 AM.  Always use your most recent med list.               albuterol 108 (90 Base) MCG/ACT inhaler  Commonly known as:  PROVENTIL HFA;VENTOLIN HFA  Inhale 2 puffs into the lungs every 6 (six) hours as needed for wheezing or shortness of breath.     atorvastatin 40 MG tablet  Commonly known as:  LIPITOR  Take 40 mg by mouth daily. PM     cetirizine 10 MG tablet  Commonly known as:  ZYRTEC  Take 10 mg by mouth daily. AM     finasteride 5 MG tablet  Commonly known as:  PROSCAR  Take 1 tablet (5 mg total) by mouth daily.     fluticasone 50 MCG/ACT nasal spray  Commonly known as:  FLONASE  Place 2 sprays into both nostrils as needed.     hydrochlorothiazide 12.5 MG tablet  Commonly known as:  HYDRODIURIL  Take 12.5 mg by mouth daily. AM     meloxicam 7.5 MG tablet  Commonly known as:  MOBIC  Reported on 11/16/2015     methylphenidate 10 MG tablet  Commonly known as:  RITALIN  Take 10 mg by mouth 2 (two) times daily.     omeprazole  20 MG capsule  Commonly known as:  PRILOSEC  Take 20 mg by mouth 2 (two) times daily before a meal.     tamsulosin 0.4 MG Caps capsule  Commonly known as:  FLOMAX  Take 0.4 mg by mouth. EVE     tiotropium 18 MCG inhalation capsule  Commonly known as:  SPIRIVA  Place 18 mcg into inhaler and inhale daily. AM     vitamin B-12 1000 MCG tablet  Commonly known as:  CYANOCOBALAMIN  Take 1,000 mcg by mouth daily.        Allergies:  Allergies  Allergen Reactions  . Hydrocodone-Homatropine Nausea And Vomiting    Family History: Family History  Problem Relation Age of Onset  . Colon cancer Father   . Stroke Mother   . Diabetes    . Hyperlipidemia    . Glaucoma    . Prostate cancer Father   . Kidney disease Neg Hx     Social History:  reports that he has quit smoking. His smoking use included Cigarettes. He has a 30 pack-year smoking history. He has never used smokeless tobacco. He reports that he drinks about 1.2 oz of alcohol per week. He reports that he does not use illicit drugs.  ROS: UROLOGY Frequent Urination?: No Hard to postpone urination?: No Burning/pain with urination?: No Get up at night to urinate?: Yes Leakage of urine?: No Urine stream starts and stops?: No Trouble starting stream?: No Do you have to strain to urinate?: No Blood in urine?: No Urinary tract infection?: No Sexually transmitted disease?: No Injury to kidneys or bladder?: No Painful intercourse?: No Weak stream?: No Erection problems?: No Penile pain?: No  Gastrointestinal Nausea?: No Vomiting?: No Indigestion/heartburn?: Yes Diarrhea?: No Constipation?: No  Constitutional Fever: No Night sweats?: No Weight loss?: No Fatigue?: No  Skin Skin rash/lesions?: No Itching?: No  Eyes Blurred vision?: No Double vision?: No  Ears/Nose/Throat Sore throat?: No Sinus problems?: Yes  Hematologic/Lymphatic Swollen glands?: No Easy bruising?: No  Cardiovascular Leg swelling?:  No Chest pain?: No  Respiratory Cough?: No Shortness of breath?: Yes  Endocrine Excessive thirst?: No  Musculoskeletal Back pain?: No Joint pain?: No  Neurological Headaches?: No Dizziness?: No  Psychologic Depression?: No Anxiety?: No  Physical Exam: BP 130/86 mmHg  Ht 5\' 8"  (1.727 m)  Wt 184 lb 8 oz (83.689 kg)  BMI 28.06 kg/m2  Constitutional: Well nourished. Alert and oriented, No acute distress. HEENT:  AT, moist mucus membranes. Trachea midline, no masses. Cardiovascular: No clubbing, cyanosis, or edema. Respiratory: Normal respiratory effort, no increased work of breathing. Skin: No rashes, bruises or suspicious lesions. Lymph: No cervical or inguinal adenopathy. Neurologic: Grossly intact, no focal deficits, moving all 4 extremities. Psychiatric: Normal mood and affect.  Laboratory Data: Lab Results  Component Value Date   WBC 12.9* 10/07/2015   HGB 15.9 10/07/2015   HCT 45.6 10/07/2015   MCV 92.4 10/07/2015   PLT 270 10/07/2015    Lab Results  Component Value Date   CREATININE 1.59* 10/07/2015    Lab Results  Component Value Date   AST 41 10/07/2015   Lab Results  Component Value Date   ALT 42 10/07/2015   Pertinent imaging Results for SPYRIDON, KENYON (MRN [id number]) as of 11/16/2015 09:13  Ref. Range 11/16/2015 08:57  Scan Result Unknown 169     Assessment & Plan:    1. Urinary retention:      - Resolved at this time   - follow-up in one year for I PSS score and  PVR   -  Patient has CIC supplies at home and he will contact the office if he should experience a difficulty in urinating  2. BPH with LUTS  -  IPSS score is 10/1  - continue 5 alpha reductase inhibitor (finasteride)  - continue tamsulosin 0.4 mg daily  - RTC in one year for IPSS and PVR  3. PSA screening:   Patient had questions regarding whether or not to obtain a PSA.  I explained to him the AUA guidelines.  AUA Guideline's (2013): The panel does not recommend  routine PSA screening in men age 78+ years or any man with less than a 10 to 42 year life expectancy.  If the individual is in excellent health and after discussion it is decided to do a screening PSA, the threshold for biopsy should be raised to 10 ng/mL and if the PSA returns below 3 ng/mL, discontinue screening.   I discussed abnormal findings with patient and possible implications, including possible prostate cancer.  The risk of the patient dying from low-grade prostate cancer at this point is also very low. We typically stop screening between the ages of 70 and 35 for this reason. At his age, the treatment for prostate cancer is palliative most often and is not initiated until the patient becomes symptomatic including worsening voiding symptoms or bone pain. Patient participated in shared decision-making today. His last PSA was 3.06 in 03/2015.  He would like to continue the PSA screening and will obtain a PSA in November with his PCP.    Return in about 1 year (around 11/15/2016) for IPSS score and exam.  These notes generated with voice recognition software. I apologize for typographical errors.  Zara Council, [facility] [address], [place], [place] [address] [phone]

## 2016-03-09 DIAGNOSIS — E7800 Pure hypercholesterolemia, unspecified: Secondary | ICD-10-CM | POA: Diagnosis not present

## 2016-03-09 DIAGNOSIS — K210 Gastro-esophageal reflux disease with esophagitis: Secondary | ICD-10-CM | POA: Diagnosis not present

## 2016-03-09 DIAGNOSIS — Z125 Encounter for screening for malignant neoplasm of prostate: Secondary | ICD-10-CM | POA: Diagnosis not present

## 2016-03-09 DIAGNOSIS — I10 Essential (primary) hypertension: Secondary | ICD-10-CM | POA: Diagnosis not present

## 2016-03-14 DIAGNOSIS — I10 Essential (primary) hypertension: Secondary | ICD-10-CM | POA: Diagnosis not present

## 2016-03-14 DIAGNOSIS — K210 Gastro-esophageal reflux disease with esophagitis: Secondary | ICD-10-CM | POA: Diagnosis not present

## 2016-03-14 DIAGNOSIS — Z125 Encounter for screening for malignant neoplasm of prostate: Secondary | ICD-10-CM | POA: Diagnosis not present

## 2016-03-14 DIAGNOSIS — E7800 Pure hypercholesterolemia, unspecified: Secondary | ICD-10-CM | POA: Diagnosis not present

## 2016-03-26 DIAGNOSIS — R05 Cough: Secondary | ICD-10-CM | POA: Diagnosis not present

## 2016-03-27 DIAGNOSIS — J439 Emphysema, unspecified: Secondary | ICD-10-CM | POA: Diagnosis not present

## 2016-07-23 DIAGNOSIS — E663 Overweight: Secondary | ICD-10-CM | POA: Diagnosis not present

## 2016-07-23 DIAGNOSIS — Z79899 Other long term (current) drug therapy: Secondary | ICD-10-CM | POA: Diagnosis not present

## 2016-07-23 DIAGNOSIS — I10 Essential (primary) hypertension: Secondary | ICD-10-CM | POA: Diagnosis not present

## 2016-07-23 DIAGNOSIS — R739 Hyperglycemia, unspecified: Secondary | ICD-10-CM | POA: Diagnosis not present

## 2016-07-23 DIAGNOSIS — E782 Mixed hyperlipidemia: Secondary | ICD-10-CM | POA: Diagnosis not present

## 2016-07-25 DIAGNOSIS — J439 Emphysema, unspecified: Secondary | ICD-10-CM | POA: Diagnosis not present

## 2016-07-25 DIAGNOSIS — E782 Mixed hyperlipidemia: Secondary | ICD-10-CM | POA: Diagnosis not present

## 2016-07-25 DIAGNOSIS — Z79899 Other long term (current) drug therapy: Secondary | ICD-10-CM | POA: Diagnosis not present

## 2016-07-25 DIAGNOSIS — I10 Essential (primary) hypertension: Secondary | ICD-10-CM | POA: Diagnosis not present

## 2016-08-29 DIAGNOSIS — H2513 Age-related nuclear cataract, bilateral: Secondary | ICD-10-CM | POA: Diagnosis not present

## 2016-10-02 DIAGNOSIS — R0602 Shortness of breath: Secondary | ICD-10-CM | POA: Diagnosis not present

## 2016-10-02 DIAGNOSIS — R911 Solitary pulmonary nodule: Secondary | ICD-10-CM | POA: Diagnosis not present

## 2016-10-02 DIAGNOSIS — J439 Emphysema, unspecified: Secondary | ICD-10-CM | POA: Diagnosis not present

## 2016-10-08 ENCOUNTER — Other Ambulatory Visit: Payer: Self-pay | Admitting: Urology

## 2016-10-08 DIAGNOSIS — N401 Enlarged prostate with lower urinary tract symptoms: Principal | ICD-10-CM

## 2016-10-08 DIAGNOSIS — N138 Other obstructive and reflux uropathy: Secondary | ICD-10-CM

## 2016-11-15 ENCOUNTER — Ambulatory Visit: Payer: PPO | Admitting: Urology

## 2016-11-16 ENCOUNTER — Ambulatory Visit (INDEPENDENT_AMBULATORY_CARE_PROVIDER_SITE_OTHER): Payer: PPO | Admitting: Urology

## 2016-11-16 ENCOUNTER — Encounter: Payer: Self-pay | Admitting: Urology

## 2016-11-16 VITALS — BP 138/81 | HR 87 | Ht 68.0 in | Wt 190.0 lb

## 2016-11-16 DIAGNOSIS — Z87898 Personal history of other specified conditions: Secondary | ICD-10-CM

## 2016-11-16 DIAGNOSIS — N400 Enlarged prostate without lower urinary tract symptoms: Secondary | ICD-10-CM

## 2016-11-16 DIAGNOSIS — Z125 Encounter for screening for malignant neoplasm of prostate: Secondary | ICD-10-CM

## 2016-11-16 LAB — BLADDER SCAN AMB NON-IMAGING: SCAN RESULT: 59

## 2016-11-16 NOTE — Progress Notes (Signed)
11/16/2016 3:01 PM   Arthur Owens 11-Dec-1940 [id number]  Referring provider: Sherrin Daisy, MD No address on file  Chief Complaint  Patient presents with  . Benign Prostatic Hypertrophy    1 year follow up  . Urinary Retention    HPI: 76 year old male with a history of urinary retention, BPH with lower urinary tract symptoms managed on finasteride (started 09/2015)  and Flomax who presented today for annual follow-up.  Most recent PSA 2.27 on 02/2016.  PVR 59 cc  Continues to have nocturia x 2-3.  He does drink coffee or water around 10 pm just before bed.  Other than this, he has no significant urinary symptoms. IPSS as below.  He does think that the accommodation of Flomax/finasteride has helped with his urinary symptoms significantly.  No gross hematuria as her urinary tract infections.       IPSS    Row Name 11/16/16 1300         International Prostate Symptom Score   How often have you had the sensation of not emptying your bladder? Not at All     How often have you had to urinate less than every two hours? Less than half the time     How often have you found you stopped and started again several times when you urinated? Less than 1 in 5 times     How often have you found it difficult to postpone urination? Not at All     How often have you had a weak urinary stream? Less than 1 in 5 times     How often have you had to strain to start urination? Not at All     How many times did you typically get up at night to urinate? 3 Times     Total IPSS Score 7       Quality of Life due to urinary symptoms   If you were to spend the rest of your life with your urinary condition just the way it is now how would you feel about that? Pleased        Score:  1-7 Mild 8-19 Moderate 20-35 Severe    PMH: Past Medical History:  Diagnosis Date  . ADD (attention deficit disorder)   . Anxiety   . Bladder neck obstruction   . Cancer ([place])    H/O ADENOMATOUS POLYP  OF COLON  . COPD (chronic obstructive pulmonary disease) ([place])   . GERD (gastroesophageal reflux disease)   . HTN (hypertension)   . Hyperlipidemia   . Seasonal allergies   . Shortness of breath dyspnea     Surgical History: Past Surgical History:  Procedure Laterality Date  . adentamous colon polyp    . APPENDECTOMY    . COLONOSCOPY WITH PROPOFOL N/A 01/28/2015   Procedure: COLONOSCOPY WITH PROPOFOL;  Surgeon: Manya Silvas, MD;  [facility] END[facility]COPY;  Service: Endoscopy;  Laterality: N/A;  . ESOPHAGOGASTRODUODENOSCOPY (EGD) WITH PROPOFOL N/A 01/28/2015   Procedure: ESOPHAGOGASTRODUODENOSCOPY (EGD) WITH PROPOFOL;  Surgeon: Manya Silvas, MD;  Location: [facility] ENDOSCOPY;  Service: Endoscopy;  Laterality: N/A;  . HERNIA REPAIR Left    LEFT INGUINAL  . left humerus debridement Left   . TONSILLECTOMY    . TRIGGER FINGER RELEASE Left 04/15/2015   Procedure: LEFT RING FINGER DUPUYTREN RELEASE;  Surgeon: Leanor Kail, MD;  Location: [place];  Service: Orthopedics;  Laterality: Left;    Home Medications:  Allergies as of 11/16/2016      Reactions  Hydrocodone-homatropine Nausea And Vomiting      Medication List       Accurate as of 11/16/16  3:01 PM. Always use your most recent med list.          albuterol 108 (90 Base) MCG/ACT inhaler Commonly known as:  PROVENTIL HFA;VENTOLIN HFA Inhale 2 puffs into the lungs every 6 (six) hours as needed for wheezing or shortness of breath.   atorvastatin 40 MG tablet Commonly known as:  LIPITOR Take 40 mg by mouth daily. PM   cetirizine 10 MG tablet Commonly known as:  ZYRTEC Take 10 mg by mouth daily. AM   finasteride 5 MG tablet Commonly known as:  PROSCAR TAKE 1 TABLET (5 MG TOTAL) BY MOUTH DAILY.   fluticasone 50 MCG/ACT nasal spray Commonly known as:  FLONASE Place 2 sprays into both nostrils as needed.   hydrochlorothiazide 12.5 MG tablet Commonly known as:  HYDRODIURIL Take 12.5 mg by mouth daily.  AM   meloxicam 7.5 MG tablet Commonly known as:  MOBIC Reported on 11/16/2015   methylphenidate 10 MG tablet Commonly known as:  RITALIN Take 10 mg by mouth 2 (two) times daily.   omeprazole 20 MG capsule Commonly known as:  PRILOSEC Take 20 mg by mouth 2 (two) times daily before a meal.   ranitidine 150 MG capsule Commonly known as:  ZANTAC Take by mouth.   tamsulosin 0.4 MG Caps capsule Commonly known as:  FLOMAX Take 0.4 mg by mouth. EVE   tiotropium 18 MCG inhalation capsule Commonly known as:  SPIRIVA Place 18 mcg into inhaler and inhale daily. AM   vitamin B-12 1000 MCG tablet Commonly known as:  CYANOCOBALAMIN Take 1,000 mcg by mouth daily.       Allergies:  Allergies  Allergen Reactions  . Hydrocodone-Homatropine Nausea And Vomiting    Family History: Family History  Problem Relation Age of Onset  . Colon cancer Father   . Prostate cancer Father   . Stroke Mother   . Diabetes Unknown   . Hyperlipidemia Unknown   . Glaucoma Unknown   . Kidney disease Neg Hx   . Kidney cancer Neg Hx   . Bladder Cancer Neg Hx     Social History:  reports that he has quit smoking. His smoking use included Cigarettes. He has a 30.00 pack-year smoking history. He has never used smokeless tobacco. He reports that he drinks about 1.2 oz of alcohol per week . He reports that he does not use drugs.  ROS: UROLOGY Frequent Urination?: No Hard to postpone urination?: No Burning/pain with urination?: No Get up at night to urinate?: Yes Leakage of urine?: No Urine stream starts and stops?: No Trouble starting stream?: No Do you have to strain to urinate?: No Blood in urine?: No Urinary tract infection?: No Sexually transmitted disease?: No Injury to kidneys or bladder?: No Painful intercourse?: No Weak stream?: No Erection problems?: No Penile pain?: No  Gastrointestinal Nausea?: No Vomiting?: No Indigestion/heartburn?: No Diarrhea?: No Constipation?:  No  Constitutional Fever: No Night sweats?: No Weight loss?: No Fatigue?: No  Skin Skin rash/lesions?: No Itching?: No  Eyes Blurred vision?: No Double vision?: No  Ears/Nose/Throat Sore throat?: No Sinus problems?: Yes  Hematologic/Lymphatic Swollen glands?: No Easy bruising?: No  Cardiovascular Leg swelling?: No Chest pain?: No  Respiratory Cough?: No Shortness of breath?: Yes  Endocrine Excessive thirst?: No  Musculoskeletal Back pain?: No Joint pain?: No  Neurological Headaches?: No Dizziness?: No  Psychologic Depression?: No Anxiety?: No  Physical Exam: BP 138/81   Pulse 87   Ht 5\' 8"  (1.727 m)   Wt 190 lb (86.2 kg)   BMI 28.89 kg/m   Constitutional:  Alert and oriented, No acute distress. HEENT: [place] AT, moist mucus membranes.  Trachea midline, no masses. Cardiovascular: No clubbing, cyanosis, or edema. Respiratory: Normal respiratory effort, no increased work of breathing. Skin: No rashes, bruises or suspicious lesions. Neurologic: Grossly intact, no focal deficits, moving all 4 extremities. Psychiatric: Normal mood and affect.  Laboratory Data: Lab Results  Component Value Date   WBC 12.9 (H) 10/07/2015   HGB 15.9 10/07/2015   HCT 45.6 10/07/2015   MCV 92.4 10/07/2015   PLT 270 10/07/2015    Cr 1.1 on 06/2016  Urinalysis    Component Value Date/Time   COLORURINE YELLOW (A) 10/07/2015 0005   APPEARANCEUR CLEAR (A) 10/07/2015 0005   LABSPEC 1.008 10/07/2015 0005   PHURINE 6.0 10/07/2015 0005   GLUCOSEU NEGATIVE 10/07/2015 0005   HGBUR NEGATIVE 10/07/2015 0005   BILIRUBINUR NEGATIVE 10/07/2015 0005   KETONESUR NEGATIVE 10/07/2015 0005   PROTEINUR NEGATIVE 10/07/2015 0005   NITRITE NEGATIVE 10/07/2015 0005   LEUKOCYTESUR NEGATIVE 10/07/2015 0005    Pertinent Imaging: Results for orders placed or performed in visit on 11/16/16  BLADDER SCAN AMB NON-IMAGING  Result Value Ref Range   Scan Result 59     Assessment & Plan:     1. Benign prostatic hyperplasia, unspecified whether lower urinary tract symptoms present Doing well on finasteride and Flomax Patient was offered the option of outlet procedure but declined, he is happy with medical therapy Past behavioral modification for nocturia symptoms - BLADDER SCAN AMB NON-IMAGING  2. History of urinary retention Postvoid residual today improved from previous  3. Screening PSA (prostate specific antigen) Based on his age, would generally recommend discontinuation of screening We did discuss study showing increased risk of high-risk prostate cancer patient on finasteride, likely not causative He is agreed to repeat the PSA in 1 year based on above discussion which is reasonable   Return in about 1 year (around 11/16/2017) for PVR, IPSS, PSA.  Hollice Espy, MD  [facility] [address] [place], [place] [address] [phone]

## 2017-01-05 ENCOUNTER — Other Ambulatory Visit: Payer: Self-pay | Admitting: Urology

## 2017-01-05 DIAGNOSIS — N401 Enlarged prostate with lower urinary tract symptoms: Principal | ICD-10-CM

## 2017-01-05 DIAGNOSIS — N138 Other obstructive and reflux uropathy: Secondary | ICD-10-CM

## 2017-01-21 DIAGNOSIS — L989 Disorder of the skin and subcutaneous tissue, unspecified: Secondary | ICD-10-CM | POA: Diagnosis not present

## 2017-01-21 DIAGNOSIS — F988 Other specified behavioral and emotional disorders with onset usually occurring in childhood and adolescence: Secondary | ICD-10-CM | POA: Diagnosis not present

## 2017-01-21 DIAGNOSIS — E7800 Pure hypercholesterolemia, unspecified: Secondary | ICD-10-CM | POA: Diagnosis not present

## 2017-01-21 DIAGNOSIS — Z23 Encounter for immunization: Secondary | ICD-10-CM | POA: Diagnosis not present

## 2017-01-21 DIAGNOSIS — Z79899 Other long term (current) drug therapy: Secondary | ICD-10-CM | POA: Diagnosis not present

## 2017-01-21 DIAGNOSIS — J439 Emphysema, unspecified: Secondary | ICD-10-CM | POA: Diagnosis not present

## 2017-01-21 DIAGNOSIS — I10 Essential (primary) hypertension: Secondary | ICD-10-CM | POA: Diagnosis not present

## 2017-02-13 DIAGNOSIS — D1800 Hemangioma unspecified site: Secondary | ICD-10-CM | POA: Diagnosis not present

## 2017-02-13 DIAGNOSIS — D485 Neoplasm of uncertain behavior of skin: Secondary | ICD-10-CM | POA: Diagnosis not present

## 2017-02-13 DIAGNOSIS — C4442 Squamous cell carcinoma of skin of scalp and neck: Secondary | ICD-10-CM | POA: Diagnosis not present

## 2017-03-04 DIAGNOSIS — C4442 Squamous cell carcinoma of skin of scalp and neck: Secondary | ICD-10-CM | POA: Diagnosis not present

## 2017-04-09 ENCOUNTER — Telehealth: Payer: Self-pay | Admitting: Urology

## 2017-04-09 DIAGNOSIS — N401 Enlarged prostate with lower urinary tract symptoms: Principal | ICD-10-CM

## 2017-04-09 DIAGNOSIS — N138 Other obstructive and reflux uropathy: Secondary | ICD-10-CM

## 2017-04-09 NOTE — Telephone Encounter (Signed)
Pt needs a 90 day supply of Finasteride.  He uses [facility] in [place].  He only has 1 left.

## 2017-04-10 MED ORDER — FINASTERIDE 5 MG PO TABS: 5.0000 mg | ORAL_TABLET | Freq: Every day | ORAL | 3 refills | Status: DC

## 2017-04-10 NOTE — Telephone Encounter (Signed)
Medication was sent to pt pharmacy  

## 2017-04-15 DIAGNOSIS — R911 Solitary pulmonary nodule: Secondary | ICD-10-CM | POA: Diagnosis not present

## 2017-04-16 DIAGNOSIS — R911 Solitary pulmonary nodule: Secondary | ICD-10-CM | POA: Diagnosis not present

## 2017-04-16 DIAGNOSIS — J432 Centrilobular emphysema: Secondary | ICD-10-CM | POA: Diagnosis not present

## 2017-05-06 DIAGNOSIS — R05 Cough: Secondary | ICD-10-CM | POA: Diagnosis not present

## 2017-05-06 DIAGNOSIS — I10 Essential (primary) hypertension: Secondary | ICD-10-CM | POA: Diagnosis not present

## 2017-07-17 DIAGNOSIS — E7800 Pure hypercholesterolemia, unspecified: Secondary | ICD-10-CM | POA: Diagnosis not present

## 2017-07-17 DIAGNOSIS — Z79899 Other long term (current) drug therapy: Secondary | ICD-10-CM | POA: Diagnosis not present

## 2017-07-17 DIAGNOSIS — I10 Essential (primary) hypertension: Secondary | ICD-10-CM | POA: Diagnosis not present

## 2017-07-22 DIAGNOSIS — J309 Allergic rhinitis, unspecified: Secondary | ICD-10-CM | POA: Diagnosis not present

## 2017-07-22 DIAGNOSIS — Z0000 Encounter for general adult medical examination without abnormal findings: Secondary | ICD-10-CM | POA: Diagnosis not present

## 2017-07-22 DIAGNOSIS — K210 Gastro-esophageal reflux disease with esophagitis: Secondary | ICD-10-CM | POA: Diagnosis not present

## 2017-07-22 DIAGNOSIS — F988 Other specified behavioral and emotional disorders with onset usually occurring in childhood and adolescence: Secondary | ICD-10-CM | POA: Diagnosis not present

## 2017-07-22 DIAGNOSIS — J432 Centrilobular emphysema: Secondary | ICD-10-CM | POA: Diagnosis not present

## 2017-07-22 DIAGNOSIS — E7800 Pure hypercholesterolemia, unspecified: Secondary | ICD-10-CM | POA: Diagnosis not present

## 2017-07-22 DIAGNOSIS — I10 Essential (primary) hypertension: Secondary | ICD-10-CM | POA: Diagnosis not present

## 2017-07-22 DIAGNOSIS — Z79899 Other long term (current) drug therapy: Secondary | ICD-10-CM | POA: Diagnosis not present

## 2017-08-30 DIAGNOSIS — H903 Sensorineural hearing loss, bilateral: Secondary | ICD-10-CM | POA: Diagnosis not present

## 2017-09-03 DIAGNOSIS — L570 Actinic keratosis: Secondary | ICD-10-CM | POA: Diagnosis not present

## 2017-09-03 DIAGNOSIS — L578 Other skin changes due to chronic exposure to nonionizing radiation: Secondary | ICD-10-CM | POA: Diagnosis not present

## 2017-09-03 DIAGNOSIS — Z872 Personal history of diseases of the skin and subcutaneous tissue: Secondary | ICD-10-CM | POA: Diagnosis not present

## 2017-09-03 DIAGNOSIS — D485 Neoplasm of uncertain behavior of skin: Secondary | ICD-10-CM | POA: Diagnosis not present

## 2017-09-03 DIAGNOSIS — Z859 Personal history of malignant neoplasm, unspecified: Secondary | ICD-10-CM | POA: Diagnosis not present

## 2017-09-03 DIAGNOSIS — L821 Other seborrheic keratosis: Secondary | ICD-10-CM | POA: Diagnosis not present

## 2017-10-29 DIAGNOSIS — D2271 Melanocytic nevi of right lower limb, including hip: Secondary | ICD-10-CM | POA: Diagnosis not present

## 2017-10-29 DIAGNOSIS — D485 Neoplasm of uncertain behavior of skin: Secondary | ICD-10-CM | POA: Diagnosis not present

## 2017-11-05 DIAGNOSIS — J439 Emphysema, unspecified: Secondary | ICD-10-CM | POA: Diagnosis not present

## 2017-11-05 DIAGNOSIS — Z87891 Personal history of nicotine dependence: Secondary | ICD-10-CM | POA: Diagnosis not present

## 2017-11-05 DIAGNOSIS — R0609 Other forms of dyspnea: Secondary | ICD-10-CM | POA: Diagnosis not present

## 2017-11-15 ENCOUNTER — Ambulatory Visit: Payer: PPO | Admitting: Urology

## 2017-12-06 ENCOUNTER — Ambulatory Visit: Payer: PPO | Admitting: Urology

## 2017-12-27 ENCOUNTER — Ambulatory Visit: Payer: PPO | Admitting: Urology

## 2018-01-03 ENCOUNTER — Other Ambulatory Visit
Admission: RE | Admit: 2018-01-03 | Discharge: 2018-01-03 | Disposition: A | Discharge status: Home / Self Care | Payer: PPO | Source: Ambulatory Visit | Attending: Urology | Admitting: Urology

## 2018-01-03 ENCOUNTER — Encounter: Payer: Self-pay | Admitting: Urology

## 2018-01-03 ENCOUNTER — Ambulatory Visit (INDEPENDENT_AMBULATORY_CARE_PROVIDER_SITE_OTHER): Payer: PPO | Admitting: Urology

## 2018-01-03 ENCOUNTER — Other Ambulatory Visit: Payer: Self-pay | Admitting: Family Medicine

## 2018-01-03 VITALS — BP 163/88 | HR 74 | Ht 68.0 in | Wt 182.0 lb

## 2018-01-03 DIAGNOSIS — N138 Other obstructive and reflux uropathy: Secondary | ICD-10-CM

## 2018-01-03 DIAGNOSIS — N401 Enlarged prostate with lower urinary tract symptoms: Secondary | ICD-10-CM

## 2018-01-03 DIAGNOSIS — R351 Nocturia: Secondary | ICD-10-CM

## 2018-01-03 LAB — PSA: PROSTATIC SPECIFIC ANTIGEN: 0.61 ng/mL (ref 0.00–4.00)

## 2018-01-03 LAB — BLADDER SCAN AMB NON-IMAGING: Scan Result: 27

## 2018-01-03 NOTE — Progress Notes (Signed)
01/03/2018 4:21 PM   Arthur Owens 1940-07-16 [id number]  Referring provider: Sherrin Daisy, MD No address on file  Chief Complaint  Patient presents with  . Benign Prostatic Hypertrophy    HPI: 77 year old male with a history of urinary retention, BPH with lower urinary tract symptoms managed on finasteride (started 09/2015)  and Flomax who presented today for annual follow-up.  Most recent PSA 2.27 on 02/2016.  Repeat drawn today and is pending.  IPSS as below.  He is primarily concerned about nocturia x 3, otherwise is pleased with his daytime voiding symptoms.  He does drink beverages up to 1 hour per prior to going to bed.  He also has a history of lower extremity edema.   Remains on Flomax/finasteride has helped with his urinary symptoms significantly.  No gross hematuria as her urinary tract infections.  PVR 27 cc  IPSS    Row Name 01/03/18 1600         International Prostate Symptom Score   How often have you had the sensation of not emptying your bladder?  Not at All     How often have you had to urinate less than every two hours?  About half the time     How often have you found you stopped and started again several times when you urinated?  Not at All     How often have you found it difficult to postpone urination?  Less than 1 in 5 times     How often have you had a weak urinary stream?  Less than 1 in 5 times     How often have you had to strain to start urination?  Not at All     How many times did you typically get up at night to urinate?  3 Times     Total IPSS Score  8       Quality of Life due to urinary symptoms   If you were to spend the rest of your life with your urinary condition just the way it is now how would you feel about that?  Mostly Satisfied        Score:  1-7 Mild 8-19 Moderate 20-35 Severe   PMH: Past Medical History:  Diagnosis Date  . ADD (attention deficit disorder)   . Anxiety   . Bladder neck obstruction   .  Cancer ([place])    H/O ADENOMATOUS POLYP OF COLON  . COPD (chronic obstructive pulmonary disease) ([place])   . GERD (gastroesophageal reflux disease)   . HTN (hypertension)   . Hyperlipidemia   . Seasonal allergies   . Shortness of breath dyspnea     Surgical History: Past Surgical History:  Procedure Laterality Date  . adentamous colon polyp    . APPENDECTOMY    . COLONOSCOPY WITH PROPOFOL N/A 01/28/2015   Procedure: COLONOSCOPY WITH PROPOFOL;  Surgeon: Manya Silvas, MD;  Location: [facility] ENDOSCOPY;  Service: Endoscopy;  Laterality: N/A;  . ESOPHAGOGASTRODUODENOSCOPY (EGD) WITH PROPOFOL N/A 01/28/2015   Procedure: ESOPHAGOGASTRODUODENOSCOPY (EGD) WITH PROPOFOL;  Surgeon: Manya Silvas, MD;  Location: [facility] END[facility]COPY;  Service: Endoscopy;  Laterality: N/A;  . HERNIA REPAIR Left    LEFT INGUINAL  . left humerus debridement Left   . TONSILLECTOMY    . TRIGGER FINGER RELEASE Left 04/15/2015   Procedure: LEFT RING FINGER DUPUYTREN RELEASE;  Surgeon: Leanor Kail, MD;  Location: [place];  Service: Orthopedics;  Laterality: Left;    Home Medications:  Allergies as of  01/03/2018      Reactions   Hydrocodone-homatropine Nausea And Vomiting      Medication List        Accurate as of 01/03/18  4:21 PM. Always use your most recent med list.          albuterol 108 (90 Base) MCG/ACT inhaler Commonly known as:  PROVENTIL HFA;VENTOLIN HFA Inhale 2 puffs into the lungs every 6 (six) hours as needed for wheezing or shortness of breath.   atorvastatin 40 MG tablet Commonly known as:  LIPITOR Take 40 mg by mouth daily. PM   cetirizine 10 MG tablet Commonly known as:  ZYRTEC Take 10 mg by mouth daily. AM   finasteride 5 MG tablet Commonly known as:  PROSCAR Take 1 tablet (5 mg total) by mouth daily.   fluticasone 50 MCG/ACT nasal spray Commonly known as:  FLONASE Place 2 sprays into both nostrils as needed.   hydrochlorothiazide 12.5 MG tablet Commonly known as:   HYDRODIURIL Take 12.5 mg by mouth daily. AM   meloxicam 7.5 MG tablet Commonly known as:  MOBIC Reported on 11/16/2015   methylphenidate 10 MG tablet Commonly known as:  RITALIN Take 10 mg by mouth 2 (two) times daily.   omeprazole 20 MG capsule Commonly known as:  PRILOSEC Take 20 mg by mouth 2 (two) times daily before a meal.   ranitidine 150 MG capsule Commonly known as:  ZANTAC Take by mouth.   tamsulosin 0.4 MG Caps capsule Commonly known as:  FLOMAX Take 0.4 mg by mouth. EVE   tiotropium 18 MCG inhalation capsule Commonly known as:  SPIRIVA Place 18 mcg into inhaler and inhale daily. AM   vitamin B-12 1000 MCG tablet Commonly known as:  CYANOCOBALAMIN Take 1,000 mcg by mouth daily.       Allergies:  Allergies  Allergen Reactions  . Hydrocodone-Homatropine Nausea And Vomiting    Family History: Family History  Problem Relation Age of Onset  . Colon cancer Father   . Prostate cancer Father   . Stroke Mother   . Diabetes Unknown   . Hyperlipidemia Unknown   . Glaucoma Unknown   . Kidney disease Neg Hx   . Kidney cancer Neg Hx   . Bladder Cancer Neg Hx     Social History:  reports that he has quit smoking. His smoking use included cigarettes. He has a 30.00 pack-year smoking history. He has never used smokeless tobacco. He reports that he drinks about 2.0 standard drinks of alcohol per week. He reports that he does not use drugs.  ROS: UROLOGY Frequent Urination?: No Hard to postpone urination?: No Burning/pain with urination?: No Get up at night to urinate?: Yes Leakage of urine?: No Urine stream starts and stops?: No Trouble starting stream?: No Do you have to strain to urinate?: No Blood in urine?: No Urinary tract infection?: No Sexually transmitted disease?: No Injury to kidneys or bladder?: No Painful intercourse?: No Weak stream?: No Erection problems?: No Penile pain?: No  Gastrointestinal Nausea?: No Vomiting?:  No Indigestion/heartburn?: No Diarrhea?: No Constipation?: No  Constitutional Fever: No Night sweats?: No Weight loss?: No Fatigue?: No  Skin Skin rash/lesions?: No Itching?: No  Eyes Blurred vision?: No Double vision?: No  Ears/Nose/Throat Sore throat?: No Sinus problems?: Yes  Hematologic/Lymphatic Swollen glands?: No Easy bruising?: No  Cardiovascular Leg swelling?: No Chest pain?: No  Respiratory Cough?: No Shortness of breath?: Yes  Endocrine Excessive thirst?: No  Musculoskeletal Back pain?: No Joint pain?: No Neurological Headaches?:  No Dizziness?: No  Psychologic Depression?: No Anxiety?: No  Physical Exam: BP (!) 163/88 (BP Location: Left Arm, Patient Position: Sitting, Cuff Size: Normal)   Pulse 74   Ht 5\' 8"  (1.727 m)   Wt 182 lb (82.6 kg)   BMI 27.67 kg/m   Constitutional:  Alert and oriented, No acute distress. HEENT: [place] AT, moist mucus membranes.  Trachea midline, no masses. MSK: Trace lower extremity edema appreciated, soft compression notable. Respiratory: Normal respiratory effort, no increased work of breathing. GI: Abdomen is soft, nontender, nondistended, no abdominal masses GU: No CVA tenderness Skin: No rashes, bruises or suspicious lesions. Neurologic: Grossly intact, no focal deficits, moving all 4 extremities. Psychiatric: Normal mood and affect.  Laboratory Data: Lab Results  Component Value Date   WBC 12.9 (H) 10/07/2015   HGB 15.9 10/07/2015   HCT 45.6 10/07/2015   MCV 92.4 10/07/2015   PLT 270 10/07/2015    Lab Results  Component Value Date   CREATININE 1.59 (H) 10/07/2015    Pertinent Imaging: n/a  Assessment & Plan:    1. BPH with obstruction/lower urinary tract symptoms Symptoms essentially stable well-controlled on Flomax and finasteride Adequate bladder emptying today We discussed PSA every 2 to 3 years given the black box warning on finasteride Recheck today - Bladder Scan (Post Void  Residual) in office  2. Nocturia We discussed behavioral modification for nocturia including avoidance of beverages 3 hours before bedtime and elevation feet during the daytime when he is nonambulatory We also discussed the pathophysiology of aging as a relates to increased nocturia  You may follow-up as needed and have his primary care prescribed his BPH meds, he will return as needed  Hollice Espy, MD  [facility] [address] [place], [place] [address] [phone]

## 2018-01-06 ENCOUNTER — Telehealth: Payer: Self-pay

## 2018-01-06 NOTE — Telephone Encounter (Signed)
-----   Message from Hollice Espy, MD sent at 01/05/2018  2:06 PM EDT ----- PSA is excellent.  0.61.  Hollice Espy, MD

## 2018-01-06 NOTE — Telephone Encounter (Signed)
Left detailed message.   

## 2018-02-11 DIAGNOSIS — Z23 Encounter for immunization: Secondary | ICD-10-CM | POA: Diagnosis not present

## 2018-04-02 ENCOUNTER — Other Ambulatory Visit: Payer: Self-pay | Admitting: Urology

## 2018-04-02 DIAGNOSIS — N401 Enlarged prostate with lower urinary tract symptoms: Principal | ICD-10-CM

## 2018-04-02 DIAGNOSIS — N138 Other obstructive and reflux uropathy: Secondary | ICD-10-CM

## 2018-04-14 DIAGNOSIS — Z79899 Other long term (current) drug therapy: Secondary | ICD-10-CM | POA: Diagnosis not present

## 2018-04-14 DIAGNOSIS — I10 Essential (primary) hypertension: Secondary | ICD-10-CM | POA: Diagnosis not present

## 2018-04-14 DIAGNOSIS — J439 Emphysema, unspecified: Secondary | ICD-10-CM | POA: Diagnosis not present

## 2018-04-14 DIAGNOSIS — R351 Nocturia: Secondary | ICD-10-CM | POA: Diagnosis not present

## 2018-04-14 DIAGNOSIS — N401 Enlarged prostate with lower urinary tract symptoms: Secondary | ICD-10-CM | POA: Diagnosis not present

## 2018-04-14 DIAGNOSIS — F988 Other specified behavioral and emotional disorders with onset usually occurring in childhood and adolescence: Secondary | ICD-10-CM | POA: Diagnosis not present

## 2018-04-14 DIAGNOSIS — K210 Gastro-esophageal reflux disease with esophagitis: Secondary | ICD-10-CM | POA: Diagnosis not present

## 2018-04-14 DIAGNOSIS — E7800 Pure hypercholesterolemia, unspecified: Secondary | ICD-10-CM | POA: Diagnosis not present

## 2018-05-08 ENCOUNTER — Ambulatory Visit
Admission: EM | Admit: 2018-05-08 | Discharge: 2018-05-08 | Disposition: A | Discharge status: Home / Self Care | Payer: PPO | Attending: Emergency Medicine | Admitting: Emergency Medicine

## 2018-05-08 ENCOUNTER — Other Ambulatory Visit: Payer: Self-pay

## 2018-05-08 ENCOUNTER — Encounter: Payer: Self-pay | Admitting: Emergency Medicine

## 2018-05-08 DIAGNOSIS — M62838 Other muscle spasm: Secondary | ICD-10-CM | POA: Insufficient documentation

## 2018-05-08 DIAGNOSIS — M6289 Other specified disorders of muscle: Secondary | ICD-10-CM

## 2018-05-08 MED ORDER — TIZANIDINE HCL 2 MG PO TABS: 2.0000 mg | ORAL_TABLET | Freq: Three times a day (TID) | ORAL | 0 refills | Status: DC | PRN

## 2018-05-08 NOTE — ED Triage Notes (Signed)
Pt c/o right leg pain. He reports that it it started in right buttock area and goes down the back side of his leg. Started about 2-3 days ago but got worse last night.

## 2018-05-08 NOTE — ED Provider Notes (Signed)
HPI  SUBJECTIVE:  Arthur Owens is a 78 y.o. male who presents with 3 days of achy, constant posterior thigh pain starting in his right buttock and extending to his knee.  States that it feels "tight".  States that he was asymptomatic yesterday, but the symptoms started again this morning.  States that it originally started while sitting in a Therapist, nutritional.  The pain does not radiate past his knee.  He reports leg weakness secondary to pain. No true weakness.  States that this feels like a "charley horse".  No back pain, hip pain, he denies sitting on anything such as a wallet.  No trauma to the area, change in his physical activity.  No distal numbness or tingling, bruising, rash.  No calf pain, swelling, edema.  He has never had symptoms like this before.  He tried a lidocaine patch and 1000 mg of Tylenol with improvement in symptoms.  Symptoms are worse with standing up, walking.  Past medical history of hypertension, COPD.  No history of diabetes, PE, DVT, back pain, sciatica.  PMD: Dr. Dorthula Perfect.    Past Medical History:  Diagnosis Date  . ADD (attention deficit disorder)   . Anxiety   . Bladder neck obstruction   . Cancer ([place])    H/O ADENOMATOUS POLYP OF COLON  . COPD (chronic obstructive pulmonary disease) ([place])   . GERD (gastroesophageal reflux disease)   . HTN (hypertension)   . Hyperlipidemia   . Seasonal allergies   . Shortness of breath dyspnea     Past Surgical History:  Procedure Laterality Date  . adentamous colon polyp    . APPENDECTOMY    . COLONOSCOPY WITH PROPOFOL N/A 01/28/2015   Procedure: COLONOSCOPY WITH PROPOFOL;  Surgeon: Manya Silvas, MD;  Location: [facility] ENDOSCOPY;  Service: Endoscopy;  Laterality: N/A;  . ESOPHAGOGASTRODUODENOSCOPY (EGD) WITH PROPOFOL N/A 01/28/2015   Procedure: ESOPHAGOGASTRODUODENOSCOPY (EGD) WITH PROPOFOL;  Surgeon: Manya Silvas, MD;  Location: [facility] ENDOSCOPY;  Service: Endoscopy;  Laterality: N/A;  . HERNIA REPAIR Left    LEFT  INGUINAL  . left humerus debridement Left   . TONSILLECTOMY    . TRIGGER FINGER RELEASE Left 04/15/2015   Procedure: LEFT RING FINGER DUPUYTREN RELEASE;  Surgeon: Leanor Kail, MD;  Location: [place];  Service: Orthopedics;  Laterality: Left;    Family History  Problem Relation Age of Onset  . Colon cancer Father   . Prostate cancer Father   . Stroke Mother   . Diabetes Other   . Hyperlipidemia Other   . Glaucoma Other   . Kidney disease Neg Hx   . Kidney cancer Neg Hx   . Bladder Cancer Neg Hx     Social History   Tobacco Use  . Smoking status: Former Smoker    Packs/day: 2.00    Years: 15.00    Pack years: 30.00    Types: Cigarettes  . Smokeless tobacco: Never Used  . Tobacco comment: quit 2 years  Substance Use Topics  . Alcohol use: Yes    Alcohol/week: 2.0 standard drinks    Types: 2 Glasses of wine per week  . Drug use: No    No current facility-administered medications for this encounter.   Current Outpatient Medications:  .  albuterol (PROVENTIL HFA;VENTOLIN HFA) 108 (90 BASE) MCG/ACT inhaler, Inhale 2 puffs into the lungs every 6 (six) hours as needed for wheezing or shortness of breath., Disp: , Rfl:  .  atorvastatin (LIPITOR) 40 MG tablet, Take 40 mg  by mouth daily. PM, Disp: , Rfl:  .  cetirizine (ZYRTEC) 10 MG tablet, Take 10 mg by mouth daily. AM, Disp: , Rfl:  .  famotidine (PEPCID) 20 MG tablet, Take by mouth., Disp: , Rfl:  .  finasteride (PROSCAR) 5 MG tablet, TAKE 1 TABLET BY MOUTH EVERY DAY, Disp: 90 tablet, Rfl: 3 .  fluticasone (FLONASE) 50 MCG/ACT nasal spray, Place 2 sprays into both nostrils as needed. , Disp: , Rfl:  .  hydrochlorothiazide (HYDRODIURIL) 12.5 MG tablet, Take 12.5 mg by mouth daily. AM, Disp: , Rfl:  .  tamsulosin (FLOMAX) 0.4 MG CAPS capsule, Take 0.4 mg by mouth. EVE, Disp: , Rfl:  .  tiotropium (SPIRIVA) 18 MCG inhalation capsule, Place 18 mcg into inhaler and inhale daily. AM, Disp: , Rfl:  .  vitamin B-12  (CYANOCOBALAMIN) 1000 MCG tablet, Take 1,000 mcg by mouth daily., Disp: , Rfl:  .  ranitidine (ZANTAC) 150 MG capsule, Take by mouth., Disp: , Rfl:  .  tiZANidine (ZANAFLEX) 2 MG tablet, Take 1 tablet (2 mg total) by mouth every 8 (eight) hours as needed for muscle spasms. May increase to 4 mg 3 times a day in 2 days., Disp: 30 tablet, Rfl: 0  Allergies  Allergen Reactions  . Hydrocodone-Homatropine Nausea And Vomiting     ROS  As noted in HPI.   Physical Exam  BP 136/79 (BP Location: Left Arm)   Pulse 73   Temp 97.9 F (36.6 C) (Oral)   Resp 18   Ht 5\' 8"  (1.727 m)   Wt 84.4 kg   SpO2 97%   BMI 28.28 kg/m   Constitutional: Well developed, well nourished, no acute distress Eyes:  EOMI, conjunctiva normal bilaterally HENT: Normocephalic, atraumatic,mucus membranes moist Respiratory: Normal inspiratory effort Cardiovascular: Normal rate GI: nondistended skin: No rash, bruising over back of the leg, skin intact Musculoskeletal: Positive tenderness at the proximal insertion of the hamstrings.  Positive tenderness, muscle spasm along the biceps femoris.  Mild tenderness along the IT band.  Pain aggravated with hip, knee flexion.  No pain with hip, knee extension.  No pain with dragging his heel backwards against the floor.  Popliteal fossa symmetric, nontender.  Calves symmetric, color, nontender, no edema.  PT 2+.  No tenderness over the L-spine or paralumbar muscles.  No pain along the gluteus.  No pain with passive hip range of motion. neurologic: Alert & oriented x 3, no focal neuro deficits, knee jerk 2+ bilaterally. Psychiatric: Speech and behavior appropriate   ED Course   Medications - No data to display  No orders of the defined types were placed in this encounter.   No results found for this or any previous visit (from the past 24 hour(s)). No results found.  ED Clinical Impression  Hamstring tightness of right lower extremity  Muscle spasm   ED  Assessment/Plan  Doubt hip pathology, DVT, back pain with sciatica, shingles, infection.  No Evidence of hamstring tear.  Presentation most consistent with a hamstring muscle spasm.  Will send home with Zanaflex 2 mg every 8 hours, may increase to 4 mg per dose in 2 days if no relief.  Combine ibuprofen 400 mg with 1 g of Tylenol together 3 or 4 times a day.  Advised deep tissue massage.  Follow-up with PMD as needed.  Discussed  MDM, treatment plan, and plan for follow-up with patient.  patient agrees with plan.   Meds ordered this encounter  Medications  . tiZANidine (ZANAFLEX) 2  MG tablet    Sig: Take 1 tablet (2 mg total) by mouth every 8 (eight) hours as needed for muscle spasms. May increase to 4 mg 3 times a day in 2 days.    Dispense:  30 tablet    Refill:  0    *This clinic note was created using [employer]. Therefore, there may be occasional mistakes despite careful proofreading.   ?   Melynda Ripple, MD 05/08/18 1208

## 2018-05-08 NOTE — Discharge Instructions (Addendum)
Zanaflex 2 mg every 8 hours, may increase to 4 mg per dose in 2 days if no relief.  Combine ibuprofen 400 mg with 1 g of Tylenol together 3 or 4 times a day.  try deep tissue massage.

## 2018-05-13 ENCOUNTER — Ambulatory Visit
Admission: RE | Admit: 2018-05-13 | Discharge: 2018-05-13 | Disposition: A | Discharge status: Home / Self Care | Payer: PPO | Attending: Internal Medicine | Admitting: Internal Medicine

## 2018-05-13 ENCOUNTER — Ambulatory Visit
Admission: RE | Admit: 2018-05-13 | Discharge: 2018-05-13 | Disposition: A | Discharge status: Home / Self Care | Payer: PPO | Source: Ambulatory Visit | Attending: Internal Medicine | Admitting: Internal Medicine

## 2018-05-13 ENCOUNTER — Other Ambulatory Visit: Payer: Self-pay | Admitting: Internal Medicine

## 2018-05-13 DIAGNOSIS — M48061 Spinal stenosis, lumbar region without neurogenic claudication: Secondary | ICD-10-CM | POA: Diagnosis not present

## 2018-05-13 DIAGNOSIS — M25551 Pain in right hip: Secondary | ICD-10-CM | POA: Diagnosis not present

## 2018-05-13 DIAGNOSIS — I10 Essential (primary) hypertension: Secondary | ICD-10-CM | POA: Diagnosis not present

## 2018-05-19 DIAGNOSIS — M6281 Muscle weakness (generalized): Secondary | ICD-10-CM | POA: Diagnosis not present

## 2018-05-19 DIAGNOSIS — M5441 Lumbago with sciatica, right side: Secondary | ICD-10-CM | POA: Diagnosis not present

## 2018-05-19 DIAGNOSIS — M25551 Pain in right hip: Secondary | ICD-10-CM | POA: Diagnosis not present

## 2018-05-26 DIAGNOSIS — M6281 Muscle weakness (generalized): Secondary | ICD-10-CM | POA: Diagnosis not present

## 2018-05-26 DIAGNOSIS — M25551 Pain in right hip: Secondary | ICD-10-CM | POA: Diagnosis not present

## 2018-05-26 DIAGNOSIS — M5441 Lumbago with sciatica, right side: Secondary | ICD-10-CM | POA: Diagnosis not present

## 2018-06-02 DIAGNOSIS — M5441 Lumbago with sciatica, right side: Secondary | ICD-10-CM | POA: Diagnosis not present

## 2018-06-02 DIAGNOSIS — M6281 Muscle weakness (generalized): Secondary | ICD-10-CM | POA: Diagnosis not present

## 2018-06-02 DIAGNOSIS — M25551 Pain in right hip: Secondary | ICD-10-CM | POA: Diagnosis not present

## 2018-06-03 DIAGNOSIS — I10 Essential (primary) hypertension: Secondary | ICD-10-CM | POA: Diagnosis not present

## 2018-06-03 DIAGNOSIS — M5416 Radiculopathy, lumbar region: Secondary | ICD-10-CM | POA: Diagnosis not present

## 2018-06-03 DIAGNOSIS — Z6829 Body mass index (BMI) 29.0-29.9, adult: Secondary | ICD-10-CM | POA: Diagnosis not present

## 2018-06-19 DIAGNOSIS — J449 Chronic obstructive pulmonary disease, unspecified: Secondary | ICD-10-CM | POA: Diagnosis not present

## 2018-09-08 DIAGNOSIS — L570 Actinic keratosis: Secondary | ICD-10-CM | POA: Diagnosis not present

## 2018-09-08 DIAGNOSIS — Z86018 Personal history of other benign neoplasm: Secondary | ICD-10-CM | POA: Diagnosis not present

## 2018-09-08 DIAGNOSIS — Z872 Personal history of diseases of the skin and subcutaneous tissue: Secondary | ICD-10-CM | POA: Diagnosis not present

## 2018-09-08 DIAGNOSIS — Z859 Personal history of malignant neoplasm, unspecified: Secondary | ICD-10-CM | POA: Diagnosis not present

## 2018-09-08 DIAGNOSIS — L578 Other skin changes due to chronic exposure to nonionizing radiation: Secondary | ICD-10-CM | POA: Diagnosis not present

## 2018-09-16 DIAGNOSIS — H524 Presbyopia: Secondary | ICD-10-CM | POA: Diagnosis not present

## 2018-09-16 DIAGNOSIS — H52223 Regular astigmatism, bilateral: Secondary | ICD-10-CM | POA: Diagnosis not present

## 2018-09-16 DIAGNOSIS — H5203 Hypermetropia, bilateral: Secondary | ICD-10-CM | POA: Diagnosis not present

## 2018-09-16 DIAGNOSIS — H2513 Age-related nuclear cataract, bilateral: Secondary | ICD-10-CM | POA: Diagnosis not present

## 2018-10-14 DIAGNOSIS — M720 Palmar fascial fibromatosis [Dupuytren]: Secondary | ICD-10-CM | POA: Diagnosis not present

## 2018-10-14 DIAGNOSIS — Z86010 Personal history of colonic polyps: Secondary | ICD-10-CM | POA: Diagnosis not present

## 2018-10-14 DIAGNOSIS — E7800 Pure hypercholesterolemia, unspecified: Secondary | ICD-10-CM | POA: Diagnosis not present

## 2018-10-14 DIAGNOSIS — Z79899 Other long term (current) drug therapy: Secondary | ICD-10-CM | POA: Diagnosis not present

## 2018-10-14 DIAGNOSIS — Z1321 Encounter for screening for nutritional disorder: Secondary | ICD-10-CM | POA: Diagnosis not present

## 2018-10-14 DIAGNOSIS — R911 Solitary pulmonary nodule: Secondary | ICD-10-CM | POA: Diagnosis not present

## 2018-10-14 DIAGNOSIS — Z131 Encounter for screening for diabetes mellitus: Secondary | ICD-10-CM | POA: Diagnosis not present

## 2018-10-14 DIAGNOSIS — Z125 Encounter for screening for malignant neoplasm of prostate: Secondary | ICD-10-CM | POA: Diagnosis not present

## 2018-10-14 DIAGNOSIS — F988 Other specified behavioral and emotional disorders with onset usually occurring in childhood and adolescence: Secondary | ICD-10-CM | POA: Diagnosis not present

## 2018-10-14 DIAGNOSIS — N138 Other obstructive and reflux uropathy: Secondary | ICD-10-CM | POA: Diagnosis not present

## 2018-10-14 DIAGNOSIS — Z0000 Encounter for general adult medical examination without abnormal findings: Secondary | ICD-10-CM | POA: Diagnosis not present

## 2018-10-14 DIAGNOSIS — R809 Proteinuria, unspecified: Secondary | ICD-10-CM | POA: Diagnosis not present

## 2018-10-14 DIAGNOSIS — I10 Essential (primary) hypertension: Secondary | ICD-10-CM | POA: Diagnosis not present

## 2018-10-14 DIAGNOSIS — J309 Allergic rhinitis, unspecified: Secondary | ICD-10-CM | POA: Diagnosis not present

## 2018-10-14 DIAGNOSIS — K210 Gastro-esophageal reflux disease with esophagitis: Secondary | ICD-10-CM | POA: Diagnosis not present

## 2018-10-14 DIAGNOSIS — N401 Enlarged prostate with lower urinary tract symptoms: Secondary | ICD-10-CM | POA: Diagnosis not present

## 2018-10-14 DIAGNOSIS — J439 Emphysema, unspecified: Secondary | ICD-10-CM | POA: Diagnosis not present

## 2018-11-05 DIAGNOSIS — Z87891 Personal history of nicotine dependence: Secondary | ICD-10-CM | POA: Diagnosis not present

## 2018-11-05 DIAGNOSIS — Z136 Encounter for screening for cardiovascular disorders: Secondary | ICD-10-CM | POA: Diagnosis not present

## 2019-01-01 ENCOUNTER — Other Ambulatory Visit: Payer: Self-pay | Admitting: Urology

## 2019-01-01 DIAGNOSIS — N138 Other obstructive and reflux uropathy: Secondary | ICD-10-CM

## 2019-04-02 ENCOUNTER — Other Ambulatory Visit: Payer: Self-pay | Admitting: Urology

## 2019-04-02 DIAGNOSIS — N138 Other obstructive and reflux uropathy: Secondary | ICD-10-CM

## 2019-04-03 ENCOUNTER — Other Ambulatory Visit: Payer: Self-pay

## 2019-04-03 ENCOUNTER — Encounter: Payer: Self-pay | Admitting: Urology

## 2019-04-03 ENCOUNTER — Ambulatory Visit (INDEPENDENT_AMBULATORY_CARE_PROVIDER_SITE_OTHER): Payer: PPO | Admitting: Urology

## 2019-04-03 VITALS — BP 144/87 | HR 76 | Ht 68.0 in | Wt 195.0 lb

## 2019-04-03 DIAGNOSIS — N138 Other obstructive and reflux uropathy: Secondary | ICD-10-CM | POA: Diagnosis not present

## 2019-04-03 DIAGNOSIS — N401 Enlarged prostate with lower urinary tract symptoms: Secondary | ICD-10-CM

## 2019-04-03 LAB — BLADDER SCAN AMB NON-IMAGING

## 2019-04-03 MED ORDER — TAMSULOSIN HCL 0.4 MG PO CAPS: 0.4000 mg | ORAL_CAPSULE | Freq: Every day | ORAL | 3 refills | Status: DC

## 2019-04-03 MED ORDER — FINASTERIDE 5 MG PO TABS: 5.0000 mg | ORAL_TABLET | Freq: Every day | ORAL | 3 refills | Status: DC

## 2019-04-03 NOTE — Progress Notes (Signed)
04/03/2019 11:44 AM   Arthur Owens [birth year] [id number]  Referring provider: No referring provider defined for this encounter.  Chief Complaint  Patient presents with  . Benign Prostatic Hypertrophy    1year     HPI: 78 year old male with personal history of BPH on maximal medical therapy, Flomax and finasteride who returns today for annual follow-up.  IPSS as below.  His symptoms appear to be well controlled.  He has no complaints today.  No side effects with the medications.  Most recent PSA 0.64 on 10/14/2018.   IPSS    Row Name 04/03/19 1300         International Prostate Symptom Score   How often have you had the sensation of not emptying your bladder? Not at All     How often have you found you stopped and started again several times when you urinated? Not at All     How often have you found it difficult to postpone urination? Less than 1 in 5 times     How often have you had a weak urinary stream? Less than 1 in 5 times     How often have you had to strain to start urination? Not at All     How many times did you typically get up at night to urinate? 4 Times     Total IPSS Score 6           Quality of Life due to urinary symptoms   If you were to spend the rest of your life with your urinary condition just the way it is now how would you feel about that? Mostly Satisfied            Score:  1-7 Mild 8-19 Moderate 20-35 Severe   PMH: Past Medical History:  Diagnosis Date  . ADD (attention deficit disorder)   . Anxiety   . Bladder neck obstruction   . Cancer ([place])    H/O ADENOMATOUS POLYP OF COLON  . COPD (chronic obstructive pulmonary disease) ([place])   . GERD (gastroesophageal reflux disease)   . HTN (hypertension)   . Hyperlipidemia   . Seasonal allergies   . Shortness of breath dyspnea     Surgical History: Past Surgical History:  Procedure Laterality Date  . adentamous colon polyp    . APPENDECTOMY    . COLONOSCOPY WITH PROPOFOL N/A  01/28/2015   Procedure: COLONOSCOPY WITH PROPOFOL;  Surgeon: Manya Silvas, MD;  Location: [facility] ENDOSCOPY;  Service: Endoscopy;  Laterality: N/A;  . ESOPHAGOGASTRODUODENOSCOPY (EGD) WITH PROPOFOL N/A 01/28/2015   Procedure: ESOPHAGOGASTRODUODENOSCOPY (EGD) WITH PROPOFOL;  Surgeon: Manya Silvas, MD;  Location: [facility] ENDOSCOPY;  Service: Endoscopy;  Laterality: N/A;  . HERNIA REPAIR Left    LEFT INGUINAL  . left humerus debridement Left   . TONSILLECTOMY    . TRIGGER FINGER RELEASE Left 04/15/2015   Procedure: LEFT RING FINGER DUPUYTREN RELEASE;  Surgeon: Leanor Kail, MD;  Location: [place];  Service: Orthopedics;  Laterality: Left;    Home Medications:  Allergies as of 04/03/2019      Reactions   Hydrocodone-homatropine Nausea And Vomiting      Medication List       Accurate as of April 03, 2019 11:59 PM. If you have any questions, ask your nurse or doctor.        STOP taking these medications   ranitidine 150 MG capsule Commonly known as: ZANTAC Stopped by: Hollice Espy, MD   tiZANidine 2 MG tablet  Commonly known as: ZANAFLEX Stopped by: Hollice Espy, MD     TAKE these medications   albuterol 108 (90 Base) MCG/ACT inhaler Commonly known as: VENTOLIN HFA Inhale 2 puffs into the lungs every 6 (six) hours as needed for wheezing or shortness of breath.   atorvastatin 40 MG tablet Commonly known as: LIPITOR Take 40 mg by mouth daily. PM   cetirizine 10 MG tablet Commonly known as: ZYRTEC Take 10 mg by mouth daily. AM   famotidine 20 MG tablet Commonly known as: PEPCID Take by mouth.   finasteride 5 MG tablet Commonly known as: PROSCAR Take 1 tablet (5 mg total) by mouth daily. Appt needed for further refills   fluticasone 50 MCG/ACT nasal spray Commonly known as: FLONASE Place 2 sprays into both nostrils as needed.   hydrochlorothiazide 12.5 MG tablet Commonly known as: HYDRODIURIL Take 12.5 mg by mouth daily. AM   tamsulosin 0.4 MG  Caps capsule Commonly known as: FLOMAX Take 1 capsule (0.4 mg total) by mouth daily. EVE What changed: when to take this Changed by: Hollice Espy, MD   tiotropium 18 MCG inhalation capsule Commonly known as: SPIRIVA Place 18 mcg into inhaler and inhale daily. AM   vitamin B-12 1000 MCG tablet Commonly known as: CYANOCOBALAMIN Take 1,000 mcg by mouth daily.   Vitamin D3 50 MCG (2000 UT) capsule Take 3 capsules daily for 3 months, then reduce to 1 capsule daily thereafter for Vitamin D Deficiency.       Allergies:  Allergies  Allergen Reactions  . Hydrocodone-Homatropine Nausea And Vomiting    Family History: Family History  Problem Relation Age of Onset  . Colon cancer Father   . Prostate cancer Father   . Stroke Mother   . Diabetes Other   . Hyperlipidemia Other   . Glaucoma Other   . Kidney disease Neg Hx   . Kidney cancer Neg Hx   . Bladder Cancer Neg Hx     Social History:  reports that he has quit smoking. His smoking use included cigarettes. He has a 30.00 pack-year smoking history. He has never used smokeless tobacco. He reports current alcohol use of about 2.0 standard drinks of alcohol per week. He reports that he does not use drugs.  ROS: UROLOGY Frequent Urination?: No Hard to postpone urination?: No Burning/pain with urination?: No Get up at night to urinate?: Yes Leakage of urine?: Yes Urine stream starts and stops?: No Trouble starting stream?: No Do you have to strain to urinate?: No Blood in urine?: No Urinary tract infection?: No Sexually transmitted disease?: No Injury to kidneys or bladder?: No Painful intercourse?: No Weak stream?: No Erection problems?: No Penile pain?: No  Gastrointestinal Nausea?: No Vomiting?: No Indigestion/heartburn?: Yes Diarrhea?: No Constipation?: No  Constitutional Fever: No Night sweats?: No Weight loss?: No Fatigue?: No  Skin Skin rash/lesions?: No Itching?: No  Eyes Blurred vision?:  No Double vision?: No  Ears/Nose/Throat Sore throat?: No Sinus problems?: Yes  Hematologic/Lymphatic Swollen glands?: No Easy bruising?: No  Cardiovascular Leg swelling?: No Chest pain?: No  Respiratory Cough?: No Shortness of breath?: Yes  Endocrine Excessive thirst?: No  Musculoskeletal Back pain?: No Joint pain?: No  Neurological Headaches?: No Dizziness?: No  Psychologic Depression?: No Anxiety?: No  Physical Exam: BP (!) 144/87   Pulse 76   Ht 5\' 8"  (1.727 m)   Wt 195 lb (88.5 kg)   BMI 29.65 kg/m   Constitutional:  Alert and oriented, No acute distress. HEENT: [place] AT,  moist mucus membranes.  Trachea midline, no masses. Cardiovascular: No clubbing, cyanosis, or edema. Respiratory: Normal respiratory effort, no increased work of breathing.. Skin: No rashes, bruises or suspicious lesions. Neurologic: Grossly intact, no focal deficits, moving all 4 extremities. Psychiatric: Normal mood and affect.  Pertinent Imaging: Results for orders placed or performed in visit on 04/03/19  Bladder Scan (Post Void Residual) in office  Result Value Ref Range   Scan Result 83ml     Assessment & Plan:    1. BPH with obstruction/lower urinary tract symptoms Stable  Continue flomax/ finasteride  - Bladder Scan (Post Void Residual) in office   Return in about 1 year (around 04/02/2020) for shannon for IPSS/ PVR.  Hollice Espy, MD  [facility] [address] [place],  [address] [phone]

## 2019-04-16 ENCOUNTER — Other Ambulatory Visit: Payer: Self-pay | Admitting: Specialist

## 2019-04-16 DIAGNOSIS — R911 Solitary pulmonary nodule: Secondary | ICD-10-CM

## 2019-04-16 DIAGNOSIS — J432 Centrilobular emphysema: Secondary | ICD-10-CM

## 2019-04-29 ENCOUNTER — Other Ambulatory Visit: Payer: Self-pay

## 2019-04-29 ENCOUNTER — Ambulatory Visit
Admission: RE | Admit: 2019-04-29 | Discharge: 2019-04-29 | Disposition: A | Discharge status: Home / Self Care | Payer: PPO | Source: Ambulatory Visit | Attending: Specialist | Admitting: Specialist

## 2019-04-29 DIAGNOSIS — I10 Essential (primary) hypertension: Secondary | ICD-10-CM | POA: Diagnosis not present

## 2019-04-29 DIAGNOSIS — R911 Solitary pulmonary nodule: Secondary | ICD-10-CM | POA: Insufficient documentation

## 2019-04-29 DIAGNOSIS — Z7189 Other specified counseling: Secondary | ICD-10-CM | POA: Diagnosis not present

## 2019-04-29 DIAGNOSIS — Z79899 Other long term (current) drug therapy: Secondary | ICD-10-CM | POA: Diagnosis not present

## 2019-04-29 DIAGNOSIS — E7800 Pure hypercholesterolemia, unspecified: Secondary | ICD-10-CM | POA: Diagnosis not present

## 2019-04-29 DIAGNOSIS — J432 Centrilobular emphysema: Secondary | ICD-10-CM | POA: Diagnosis not present

## 2019-04-29 DIAGNOSIS — Z1159 Encounter for screening for other viral diseases: Secondary | ICD-10-CM | POA: Diagnosis not present

## 2019-04-29 DIAGNOSIS — Z0000 Encounter for general adult medical examination without abnormal findings: Secondary | ICD-10-CM | POA: Diagnosis not present

## 2019-09-08 DIAGNOSIS — L578 Other skin changes due to chronic exposure to nonionizing radiation: Secondary | ICD-10-CM | POA: Diagnosis not present

## 2019-09-08 DIAGNOSIS — Z86018 Personal history of other benign neoplasm: Secondary | ICD-10-CM | POA: Diagnosis not present

## 2019-09-08 DIAGNOSIS — Z859 Personal history of malignant neoplasm, unspecified: Secondary | ICD-10-CM | POA: Diagnosis not present

## 2019-09-08 DIAGNOSIS — Z872 Personal history of diseases of the skin and subcutaneous tissue: Secondary | ICD-10-CM | POA: Diagnosis not present

## 2019-09-08 DIAGNOSIS — L570 Actinic keratosis: Secondary | ICD-10-CM | POA: Diagnosis not present

## 2019-10-15 DIAGNOSIS — J439 Emphysema, unspecified: Secondary | ICD-10-CM | POA: Diagnosis not present

## 2019-10-15 DIAGNOSIS — R0600 Dyspnea, unspecified: Secondary | ICD-10-CM | POA: Diagnosis not present

## 2019-10-15 DIAGNOSIS — J984 Other disorders of lung: Secondary | ICD-10-CM | POA: Diagnosis not present

## 2019-11-03 DIAGNOSIS — Z0000 Encounter for general adult medical examination without abnormal findings: Secondary | ICD-10-CM | POA: Diagnosis not present

## 2019-11-03 DIAGNOSIS — I10 Essential (primary) hypertension: Secondary | ICD-10-CM | POA: Diagnosis not present

## 2019-11-03 DIAGNOSIS — N401 Enlarged prostate with lower urinary tract symptoms: Secondary | ICD-10-CM | POA: Diagnosis not present

## 2019-11-03 DIAGNOSIS — J432 Centrilobular emphysema: Secondary | ICD-10-CM | POA: Diagnosis not present

## 2019-11-03 DIAGNOSIS — Z131 Encounter for screening for diabetes mellitus: Secondary | ICD-10-CM | POA: Diagnosis not present

## 2019-11-03 DIAGNOSIS — Z1329 Encounter for screening for other suspected endocrine disorder: Secondary | ICD-10-CM | POA: Diagnosis not present

## 2019-11-03 DIAGNOSIS — E7800 Pure hypercholesterolemia, unspecified: Secondary | ICD-10-CM | POA: Diagnosis not present

## 2019-11-03 DIAGNOSIS — G8929 Other chronic pain: Secondary | ICD-10-CM | POA: Diagnosis not present

## 2019-11-03 DIAGNOSIS — R7309 Other abnormal glucose: Secondary | ICD-10-CM | POA: Diagnosis not present

## 2019-11-03 DIAGNOSIS — N138 Other obstructive and reflux uropathy: Secondary | ICD-10-CM | POA: Diagnosis not present

## 2019-11-03 DIAGNOSIS — M25511 Pain in right shoulder: Secondary | ICD-10-CM | POA: Diagnosis not present

## 2019-11-03 DIAGNOSIS — Z125 Encounter for screening for malignant neoplasm of prostate: Secondary | ICD-10-CM | POA: Diagnosis not present

## 2020-03-16 DIAGNOSIS — M7581 Other shoulder lesions, right shoulder: Secondary | ICD-10-CM | POA: Diagnosis not present

## 2020-03-16 DIAGNOSIS — M25511 Pain in right shoulder: Secondary | ICD-10-CM | POA: Diagnosis not present

## 2020-04-03 NOTE — Progress Notes (Signed)
04/04/2020 1:31 PM   Arthur Owens [birth year] [id number]  Referring provider: No referring provider defined for this encounter.  Chief Complaint  Patient presents with  . Follow-up    1 year follow-up    HPI: 79 year old male with personal history of BPH on maximal medical therapy, Flomax and finasteride who returns today for annual follow-up.  IPSS as below.  His symptoms appear to be well controlled.  He has no complaints today.  No side effects with the medications.  Most recent PSA 0.70 on 10/2019   IPSS    Row Name 04/04/20 1300         International Prostate Symptom Score   How often have you had the sensation of not emptying your bladder? Not at All     How often have you had to urinate less than every two hours? Less than 1 in 5 times     How often have you found you stopped and started again several times when you urinated? Not at All     How often have you found it difficult to postpone urination? Not at All     How often have you had a weak urinary stream? Not at All     How often have you had to strain to start urination? Not at All     How many times did you typically get up at night to urinate? 3 Times     Total IPSS Score 4       Quality of Life due to urinary symptoms   If you were to spend the rest of your life with your urinary condition just the way it is now how would you feel about that? Pleased            Score:  1-7 Mild 8-19 Moderate 20-35 Severe   PMH: Past Medical History:  Diagnosis Date  . ADD (attention deficit disorder)   . Anxiety   . Bladder neck obstruction   . Cancer ([place])    H/O ADENOMATOUS POLYP OF COLON  . COPD (chronic obstructive pulmonary disease) ([place])   . GERD (gastroesophageal reflux disease)   . HTN (hypertension)   . Hyperlipidemia   . Seasonal allergies   . Shortness of breath dyspnea     Surgical History: Past Surgical History:  Procedure Laterality Date  . adentamous colon polyp    . APPENDECTOMY     . COLONOSCOPY WITH PROPOFOL N/A 01/28/2015   Procedure: COLONOSCOPY WITH PROPOFOL;  Surgeon: Manya Silvas, MD;  Location: [facility] ENDOSCOPY;  Service: Endoscopy;  Laterality: N/A;  . ESOPHAGOGASTRODUODENOSCOPY (EGD) WITH PROPOFOL N/A 01/28/2015   Procedure: ESOPHAGOGASTRODUODENOSCOPY (EGD) WITH PROPOFOL;  Surgeon: Manya Silvas, MD;  Location: [facility] ENDOSCOPY;  Service: Endoscopy;  Laterality: N/A;  . HERNIA REPAIR Left    LEFT INGUINAL  . left humerus debridement Left   . TONSILLECTOMY    . TRIGGER FINGER RELEASE Left 04/15/2015   Procedure: LEFT RING FINGER DUPUYTREN RELEASE;  Surgeon: Leanor Kail, MD;  Location: [place];  Service: Orthopedics;  Laterality: Left;    Home Medications:  Allergies as of 04/04/2020      Reactions   Hydrocodone-homatropine Nausea And Vomiting      Medication List       Accurate as of April 04, 2020  1:31 PM. If you have any questions, ask your nurse or doctor.        albuterol 108 (90 Base) MCG/ACT inhaler Commonly known as: VENTOLIN HFA Inhale 2 puffs  into the lungs every 6 (six) hours as needed for wheezing or shortness of breath.   atorvastatin 40 MG tablet Commonly known as: LIPITOR Take 40 mg by mouth daily. PM   cetirizine 10 MG tablet Commonly known as: ZYRTEC Take 10 mg by mouth daily. AM   famotidine 20 MG tablet Commonly known as: PEPCID Take by mouth.   finasteride 5 MG tablet Commonly known as: PROSCAR Take 1 tablet (5 mg total) by mouth daily. Appt needed for further refills   fluticasone 50 MCG/ACT nasal spray Commonly known as: FLONASE Place 2 sprays into both nostrils as needed.   hydrochlorothiazide 12.5 MG tablet Commonly known as: HYDRODIURIL Take 12.5 mg by mouth daily. AM   tamsulosin 0.4 MG Caps capsule Commonly known as: FLOMAX Take 1 capsule (0.4 mg total) by mouth daily. EVE   tiotropium 18 MCG inhalation capsule Commonly known as: SPIRIVA Place 18 mcg into inhaler and inhale daily.  AM   vitamin B-12 1000 MCG tablet Commonly known as: CYANOCOBALAMIN Take 1,000 mcg by mouth daily.   Vitamin D3 50 MCG (2000 UT) capsule Take 3 capsules daily for 3 months, then reduce to 1 capsule daily thereafter for Vitamin D Deficiency.       Allergies:  Allergies  Allergen Reactions  . Hydrocodone-Homatropine Nausea And Vomiting    Family History: Family History  Problem Relation Age of Onset  . Colon cancer Father   . Prostate cancer Father   . Stroke Mother   . Diabetes Other   . Hyperlipidemia Other   . Glaucoma Other   . Kidney disease Neg Hx   . Kidney cancer Neg Hx   . Bladder Cancer Neg Hx     Social History:  reports that he has quit smoking. His smoking use included cigarettes. He has a 30.00 pack-year smoking history. He has never used smokeless tobacco. He reports current alcohol use of about 2.0 standard drinks of alcohol per week. He reports that he does not use drugs.  ROS: For pertinent review of systems please refer to history of present illness  Physical Exam: BP (!) 156/89   Pulse 71   Ht 5\' 8"  (1.727 m)   Wt 191 lb (86.6 kg)   BMI 29.04 kg/m   Constitutional:  Well nourished. Alert and oriented, No acute distress. HEENT: [place] AT, mask in place.  Trachea midline Cardiovascular: No clubbing, cyanosis, or edema. Respiratory: Normal respiratory effort, no increased work of breathing. Neurologic: Grossly intact, no focal deficits, moving all 4 extremities. Psychiatric: Normal mood and affect.   Laboratory Data: Lab Results  Component Value Date   WBC 12.9 (H) 10/07/2015   HGB 15.9 10/07/2015   HCT 45.6 10/07/2015   MCV 92.4 10/07/2015   PLT 270 10/07/2015    Lab Results  Component Value Date   CREATININE 1.59 (H) 10/07/2015    Urinalysis    Component Value Date/Time   COLORURINE YELLOW (A) 10/07/2015 0005   APPEARANCEUR CLEAR (A) 10/07/2015 0005   LABSPEC 1.008 10/07/2015 0005   PHURINE 6.0 10/07/2015 0005   GLUCOSEU NEGATIVE  10/07/2015 0005   HGBUR NEGATIVE 10/07/2015 0005   BILIRUBINUR NEGATIVE 10/07/2015 0005   KETONESUR NEGATIVE 10/07/2015 0005   PROTEINUR NEGATIVE 10/07/2015 0005   NITRITE NEGATIVE 10/07/2015 0005   LEUKOCYTESUR NEGATIVE 10/07/2015 0005    Lab Results  Component Value Date   BACTERIA RARE (A) 10/07/2015    Pertinent Imaging: Results for DYLON, CORREA (MRN [id number]) as of 04/04/2020 13:26  Ref.  Range 04/04/2020 13:22  Scan Result Unknown 0 ml    Assessment & Plan:    1. BPH with obstruction/lower urinary tract symptoms - Bladder Scan (Post Void Residual) in office - No complaints - continue tamsulosin and finasteride - refills given - RTC in one year for I PSS and PVR   Return in about 1 year (around 04/04/2021) for IPSS and PVR.  Zara Council, PA-C  [facility] [address], [place], [place] [address] [phone]

## 2020-04-04 ENCOUNTER — Other Ambulatory Visit: Payer: Self-pay | Admitting: *Deleted

## 2020-04-04 ENCOUNTER — Encounter: Payer: Self-pay | Admitting: Urology

## 2020-04-04 ENCOUNTER — Ambulatory Visit: Payer: PPO | Admitting: Urology

## 2020-04-04 ENCOUNTER — Other Ambulatory Visit: Payer: Self-pay

## 2020-04-04 VITALS — BP 156/89 | HR 71 | Ht 68.0 in | Wt 191.0 lb

## 2020-04-04 DIAGNOSIS — N401 Enlarged prostate with lower urinary tract symptoms: Secondary | ICD-10-CM

## 2020-04-04 DIAGNOSIS — N138 Other obstructive and reflux uropathy: Secondary | ICD-10-CM | POA: Diagnosis not present

## 2020-04-04 LAB — BLADDER SCAN AMB NON-IMAGING: Scan Result: 0

## 2020-04-04 MED ORDER — TAMSULOSIN HCL 0.4 MG PO CAPS: 0.4000 mg | ORAL_CAPSULE | Freq: Every day | ORAL | 3 refills | Status: DC

## 2020-04-04 MED ORDER — FINASTERIDE 5 MG PO TABS: 5.0000 mg | ORAL_TABLET | Freq: Every day | ORAL | 3 refills | Status: DC

## 2020-04-14 DIAGNOSIS — J439 Emphysema, unspecified: Secondary | ICD-10-CM | POA: Diagnosis not present

## 2020-04-14 DIAGNOSIS — R911 Solitary pulmonary nodule: Secondary | ICD-10-CM | POA: Diagnosis not present

## 2020-04-14 DIAGNOSIS — J432 Centrilobular emphysema: Secondary | ICD-10-CM | POA: Diagnosis not present

## 2020-04-14 DIAGNOSIS — R918 Other nonspecific abnormal finding of lung field: Secondary | ICD-10-CM | POA: Diagnosis not present

## 2020-05-02 DIAGNOSIS — M79675 Pain in left toe(s): Secondary | ICD-10-CM | POA: Diagnosis not present

## 2020-05-02 DIAGNOSIS — M79674 Pain in right toe(s): Secondary | ICD-10-CM | POA: Diagnosis not present

## 2020-05-02 DIAGNOSIS — B351 Tinea unguium: Secondary | ICD-10-CM | POA: Diagnosis not present

## 2020-05-06 DIAGNOSIS — N138 Other obstructive and reflux uropathy: Secondary | ICD-10-CM | POA: Diagnosis not present

## 2020-05-06 DIAGNOSIS — H25092 Other age-related incipient cataract, left eye: Secondary | ICD-10-CM | POA: Diagnosis not present

## 2020-05-06 DIAGNOSIS — M25511 Pain in right shoulder: Secondary | ICD-10-CM | POA: Diagnosis not present

## 2020-05-06 DIAGNOSIS — R7303 Prediabetes: Secondary | ICD-10-CM | POA: Diagnosis not present

## 2020-05-06 DIAGNOSIS — Z86010 Personal history of colonic polyps: Secondary | ICD-10-CM | POA: Diagnosis not present

## 2020-05-06 DIAGNOSIS — J309 Allergic rhinitis, unspecified: Secondary | ICD-10-CM | POA: Diagnosis not present

## 2020-05-06 DIAGNOSIS — N401 Enlarged prostate with lower urinary tract symptoms: Secondary | ICD-10-CM | POA: Diagnosis not present

## 2020-05-06 DIAGNOSIS — I10 Essential (primary) hypertension: Secondary | ICD-10-CM | POA: Diagnosis not present

## 2020-05-06 DIAGNOSIS — J432 Centrilobular emphysema: Secondary | ICD-10-CM | POA: Diagnosis not present

## 2020-05-06 DIAGNOSIS — G8929 Other chronic pain: Secondary | ICD-10-CM | POA: Diagnosis not present

## 2020-07-11 DIAGNOSIS — Z86010 Personal history of colonic polyps: Secondary | ICD-10-CM | POA: Diagnosis not present

## 2020-07-11 DIAGNOSIS — K2100 Gastro-esophageal reflux disease with esophagitis, without bleeding: Secondary | ICD-10-CM | POA: Diagnosis not present

## 2020-08-19 ENCOUNTER — Emergency Department: Payer: PPO

## 2020-08-19 ENCOUNTER — Other Ambulatory Visit: Payer: Self-pay

## 2020-08-19 ENCOUNTER — Encounter: Payer: Self-pay | Admitting: Emergency Medicine

## 2020-08-19 ENCOUNTER — Observation Stay
Admission: EM | Admit: 2020-08-19 | Discharge: 2020-08-20 | Disposition: A | Discharge status: Home / Self Care | Payer: PPO | Attending: Internal Medicine | Admitting: Internal Medicine

## 2020-08-19 DIAGNOSIS — S0181XA Laceration without foreign body of other part of head, initial encounter: Secondary | ICD-10-CM | POA: Diagnosis not present

## 2020-08-19 DIAGNOSIS — S01412A Laceration without foreign body of left cheek and temporomandibular area, initial encounter: Principal | ICD-10-CM | POA: Insufficient documentation

## 2020-08-19 DIAGNOSIS — N400 Enlarged prostate without lower urinary tract symptoms: Secondary | ICD-10-CM

## 2020-08-19 DIAGNOSIS — Y9301 Activity, walking, marching and hiking: Secondary | ICD-10-CM | POA: Insufficient documentation

## 2020-08-19 DIAGNOSIS — I10 Essential (primary) hypertension: Secondary | ICD-10-CM | POA: Insufficient documentation

## 2020-08-19 DIAGNOSIS — Z87891 Personal history of nicotine dependence: Secondary | ICD-10-CM | POA: Diagnosis not present

## 2020-08-19 DIAGNOSIS — Z79899 Other long term (current) drug therapy: Secondary | ICD-10-CM | POA: Insufficient documentation

## 2020-08-19 DIAGNOSIS — Z20822 Contact with and (suspected) exposure to covid-19: Secondary | ICD-10-CM | POA: Diagnosis not present

## 2020-08-19 DIAGNOSIS — J449 Chronic obstructive pulmonary disease, unspecified: Secondary | ICD-10-CM | POA: Insufficient documentation

## 2020-08-19 DIAGNOSIS — R55 Syncope and collapse: Secondary | ICD-10-CM

## 2020-08-19 DIAGNOSIS — W1781XA Fall down embankment (hill), initial encounter: Secondary | ICD-10-CM | POA: Insufficient documentation

## 2020-08-19 DIAGNOSIS — Z85038 Personal history of other malignant neoplasm of large intestine: Secondary | ICD-10-CM | POA: Diagnosis not present

## 2020-08-19 DIAGNOSIS — Z043 Encounter for examination and observation following other accident: Secondary | ICD-10-CM | POA: Diagnosis not present

## 2020-08-19 HISTORY — DX: Syncope and collapse: R55

## 2020-08-19 LAB — BASIC METABOLIC PANEL
Anion gap: 9 (ref 5–15)
BUN: 22 mg/dL (ref 8–23)
CO2: 23 mmol/L (ref 22–32)
Calcium: 9.3 mg/dL (ref 8.9–10.3)
Chloride: 104 mmol/L (ref 98–111)
Creatinine, Ser: 1.17 mg/dL (ref 0.61–1.24)
GFR, Estimated: >60 mL/min (ref >60)
Glucose, Bld: 108 mg/dL — ABNORMAL HIGH (ref 70–99)
Potassium: 3.6 mmol/L (ref 3.5–5.1)
Sodium: 136 mmol/L (ref 135–145)

## 2020-08-19 LAB — CBC WITH DIFFERENTIAL/PLATELET
Abs Immature Granulocytes: 0.03 10*3/uL (ref 0.00–0.07)
Basophils Absolute: 0 10*3/uL (ref 0.0–0.1)
Basophils Relative: 0 %
Eosinophils Absolute: 0.1 10*3/uL (ref 0.0–0.5)
Eosinophils Relative: 1 %
HCT: 41.1 % (ref 39.0–52.0)
Hemoglobin: 14.2 g/dL (ref 13.0–17.0)
Immature Granulocytes: 0 %
Lymphocytes Relative: 14 %
Lymphs Abs: 1.2 10*3/uL (ref 0.7–4.0)
MCH: 32.1 pg (ref 26.0–34.0)
MCHC: 34.5 g/dL (ref 30.0–36.0)
MCV: 93 fL (ref 80.0–100.0)
Monocytes Absolute: 0.6 10*3/uL (ref 0.1–1.0)
Monocytes Relative: 7 %
Neutro Abs: 6.8 10*3/uL (ref 1.7–7.7)
Neutrophils Relative %: 78 %
Platelets: 200 10*3/uL (ref 150–400)
RBC: 4.42 MIL/uL (ref 4.22–5.81)
RDW: 13.2 % (ref 11.5–15.5)
WBC: 8.7 10*3/uL (ref 4.0–10.5)
nRBC: 0 % (ref 0.0–0.2)

## 2020-08-19 LAB — TROPONIN I (HIGH SENSITIVITY): Troponin I (High Sensitivity): 7 ng/L (ref <18)

## 2020-08-19 MED ORDER — LIDOCAINE HCL (PF) 1 % IJ SOLN
5.0000 mL | Freq: Once | INTRAMUSCULAR | Status: AC
  Administered 2020-08-19: 5 mL
  Filled 2020-08-19: qty 5

## 2020-08-19 NOTE — ED Notes (Signed)
Assigned as Primary RN at this time. First encounter with the patient. Patient is lying comfortably and in no acute distress. Will continue to monitor and assess.

## 2020-08-19 NOTE — ED Provider Notes (Signed)
  [facility] EMERGENCY DEPARTMENT Provider Note    Procedures .Marland KitchenLaceration Repair  Date/Time: 08/19/2020 11:32 PM Performed by: Duanne Guess, PA-C Authorized by: Duanne Guess, PA-C   Consent:    Consent obtained:  Verbal   Consent given by:  Patient   Risks discussed:  Infection, pain and poor cosmetic result   Alternatives discussed:  No treatment Universal protocol:    Patient identity confirmed:  Verbally with patient Anesthesia:    Anesthesia method:  Local infiltration   Local anesthetic:  Lidocaine 1% w/o epi Laceration details:    Location:  Face   Face location:  L cheek   Length (cm):  4   Depth (mm):  3 Exploration:    Imaging outcome: foreign body not noted     Wound exploration: entire depth of wound visualized     Wound extent: no muscle damage noted     Contaminated: no   Treatment:    Area cleansed with:  Povidone-iodine and saline   Amount of cleaning:  Standard   Irrigation volume:  30 cc   Irrigation method:  Pressure wash   Visualized foreign bodies/material removed: no     Debridement:  None Skin repair:    Repair method:  Sutures   Suture size:  6-0   Suture material:  Nylon   Suture technique:  Simple interrupted   Number of sutures:  7 Approximation:    Approximation:  Close Repair type:    Repair type:  Simple Post-procedure details:    Dressing:  Open (no dressing)   Procedure completion:  Tolerated well, no immediate complications     [name] 08/19/20 [id number]    Nance Pear, MD 08/19/20 [phone]

## 2020-08-19 NOTE — ED Notes (Signed)
Per spouse and pt, pt with LOC. Assisted pt out of car with spouse at side who states she found him on concrete and he did not remember what happened of that his spouse previously had an arm injury. Pt with abrasion s noted to face, arm. Rigid c collar given to mimi to apply,

## 2020-08-19 NOTE — ED Triage Notes (Signed)
Pt to ED via POV. Pt stated that he started keto pills recently and was walking up a hill and fell. Pt hit his head and has a laceration on the left side of his face; bleeding is controlled. Pt is a/ox4. Pt denies pain at this time.

## 2020-08-19 NOTE — ED Provider Notes (Signed)
[facility] Emergency Department Provider Note   ____________________________________________   I have reviewed the triage vital signs and the nursing notes.   HISTORY  Chief Complaint Fall and Loss of Consciousness   History limited by: Not Limited   HPI Arthur Owens is a 80 y.o. male who presents to the emergency department today because of concern for a syncopal episode. The patient states he was walking up a hill when he passed out. It occurred all of a sudden. The patient denies feeling anything coming on. The patient states that he did not have any chest pain or palpitations. Denies any recent fevers or illness. Did suffer a laceration to the left side of his face. The patient thinks this might be related to some keto pills that he recently started.    Records reviewed. Per medical record review patient has a history of HLD, HTN, GERD, COPD.   Past Medical History:  Diagnosis Date  . ADD (attention deficit disorder)   . Anxiety   . Bladder neck obstruction   . Cancer ([place])    H/O ADENOMATOUS POLYP OF COLON  . COPD (chronic obstructive pulmonary disease) ([place])   . GERD (gastroesophageal reflux disease)   . HTN (hypertension)   . Hyperlipidemia   . Seasonal allergies   . Shortness of breath dyspnea     Patient Active Problem List   Diagnosis Date Noted  . BPH with obstruction/lower urinary tract symptoms 10/20/2015  . Contracture of palmar fascia (Dupuytren's) 04/26/2015  . Chronic hoarseness 12/14/2014  . Brash 12/14/2014  . H/O adenomatous polyp of colon 12/14/2014  . 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 Essential (primary) hypertension 02/11/2014  . Chronic obstructive pulmonary disease ([place]) 01/13/2014  . Lung mass 01/13/2014  . Other specified behavioral and emotional disorders with onset usually occurring in childhood and adolescence 10/01/2013  . Bladder neck obstruction 10/01/2013  . Acid reflux 10/01/2013  .  Hypercholesterolemia 10/01/2013  . Allergic rhinitis, seasonal 10/01/2013    Past Surgical History:  Procedure Laterality Date  . adentamous colon polyp    . APPENDECTOMY    . COLONOSCOPY WITH PROPOFOL N/A 01/28/2015   Procedure: COLONOSCOPY WITH PROPOFOL;  Surgeon: Manya Silvas, MD;  Location: [facility] ENDOSCOPY;  Service: Endoscopy;  Laterality: N/A;  . ESOPHAGOGASTRODUODENOSCOPY (EGD) WITH PROPOFOL N/A 01/28/2015   Procedure: ESOPHAGOGASTRODUODENOSCOPY (EGD) WITH PROPOFOL;  Surgeon: Manya Silvas, MD;  Location: [facility] ENDOSCOPY;  Service: Endoscopy;  Laterality: N/A;  . HERNIA REPAIR Left    LEFT INGUINAL  . left humerus debridement Left   . TONSILLECTOMY    . TRIGGER FINGER RELEASE Left 04/15/2015   Procedure: LEFT RING FINGER DUPUYTREN RELEASE;  Surgeon: Leanor Kail, MD;  Location: [place];  Service: Orthopedics;  Laterality: Left;    Prior to Admission medications   Medication Sig Start Date End Date Taking? Authorizing Provider  albuterol (PROVENTIL HFA;VENTOLIN HFA) 108 (90 BASE) MCG/ACT inhaler Inhale 2 puffs into the lungs every 6 (six) hours as needed for wheezing or shortness of breath.    [provider]  atorvastatin (LIPITOR) 40 MG tablet Take 40 mg by mouth daily. PM    [provider]  cetirizine (ZYRTEC) 10 MG tablet Take 10 mg by mouth daily. AM    [provider]  Cholecalciferol (VITAMIN D3) 50 MCG (2000 UT) capsule Take 3 capsules daily for 3 months, then reduce to 1 capsule daily thereafter for Vitamin D Deficiency. 10/15/18   [provider]  famotidine (PEPCID)  20 MG tablet Take by mouth. 04/14/18   [provider]  finasteride (PROSCAR) 5 MG tablet Take 1 tablet (5 mg total) by mouth daily. Appt needed for further refills 04/04/20   Zara Council A, PA-C  fluticasone (FLONASE) 50 MCG/ACT nasal spray Place 2 sprays into both nostrils as needed.     [provider]  hydrochlorothiazide  (HYDRODIURIL) 12.5 MG tablet Take 12.5 mg by mouth daily. AM    [provider]  tamsulosin (FLOMAX) 0.4 MG CAPS capsule Take 1 capsule (0.4 mg total) by mouth daily. EVE 04/04/20   McGowan, Larene Beach A, PA-C  tiotropium (SPIRIVA) 18 MCG inhalation capsule Place 18 mcg into inhaler and inhale daily. AM    [provider]  vitamin B-12 (CYANOCOBALAMIN) 1000 MCG tablet Take 1,000 mcg by mouth daily.    [provider]    Allergies Hydrocodone-homatropine  Family History  Problem Relation Age of Onset  . Colon cancer Father   . Prostate cancer Father   . Stroke Mother   . Diabetes Other   . Hyperlipidemia Other   . Glaucoma Other   . Kidney disease Neg Hx   . Kidney cancer Neg Hx   . Bladder Cancer Neg Hx     Social History Social History   Tobacco Use  . Smoking status: Former Smoker    Packs/day: 2.00    Years: 15.00    Pack years: 30.00    Types: Cigarettes  . Smokeless tobacco: Never Used  . Tobacco comment: quit 2 years  Vaping Use  . Vaping Use: Never used  Substance Use Topics  . Alcohol use: Yes    Alcohol/week: 2.0 standard drinks    Types: 2 Glasses of wine per week  . Drug use: No    Review of Systems Constitutional: No fever/chills Eyes: No visual changes. ENT: No sore throat. Cardiovascular: Denies chest pain. Respiratory: Denies shortness of breath. Gastrointestinal: No abdominal pain.  No nausea, no vomiting.  No diarrhea.   Genitourinary: Negative for dysuria. Musculoskeletal: Negative for back pain. Skin: Negative for rash. Neurological: Positive for syncopal episode.  ____________________________________________   PHYSICAL EXAM:  VITAL SIGNS: ED Triage Vitals  Enc Vitals Group     BP 08/19/20 2147 (!) 151/86     Pulse Rate 08/19/20 2147 66     Resp 08/19/20 2147 18     Temp 08/19/20 2147 97.8 F (36.6 C)     Temp Source 08/19/20 2147 Oral     SpO2 08/19/20 2147 100 %     Weight 08/19/20 2145 194 lb (88 kg)      Height 08/19/20 2145 5\' 8"  (1.727 m)     Head Circumference --      Peak Flow --      Pain Score 08/19/20 2145 0   Constitutional: Alert and oriented.  Eyes: Conjunctivae are normal.  ENT      Head: Normocephalic. Laceration to left cheek.       Nose: No congestion/rhinnorhea.      Mouth/Throat: Mucous membranes are moist.      Neck: No stridor. Hematological/Lymphatic/Immunilogical: No cervical lymphadenopathy. Cardiovascular: Normal rate, regular rhythm.  No murmurs, rubs, or gallops.  Respiratory: Normal respiratory effort without tachypnea nor retractions. Breath sounds are clear and equal bilaterally. No wheezes/rales/rhonchi. Gastrointestinal: Soft and non tender. No rebound. No guarding.  Genitourinary: Deferred Musculoskeletal: Normal range of motion in all extremities. No lower extremity edema. Neurologic:  Normal speech and language. No gross focal neurologic deficits  are appreciated.  Skin:  Skin is warm, dry and intact. No rash noted. Psychiatric: Mood and affect are normal. Speech and behavior are normal. Patient exhibits appropriate insight and judgment.  ____________________________________________    LABS (pertinent positives/negatives)  Trop hs 7 BMP wnl except glu 108 CBC wbc 8.7, hgb 14.2, plt 200   ____________________________________________    RADIOLOGY  CT head/cervical/max face No acute injury  ____________________________________________   PROCEDURES  Procedures  ____________________________________________   INITIAL IMPRESSION / ASSESSMENT AND PLAN / ED COURSE  Pertinent labs & imaging results that were available during my care of the patient were reviewed by me and considered in my medical decision making (see chart for details).   Patient presented to the emergency department today because of concern for a syncopal episode. On exam patient does have a laceration to his left face and abrasions. CT imaging without concerning acute  findings. Did discuss work up with patient. Will plan on admission.  ____________________________________________   FINAL CLINICAL IMPRESSION(S) / ED DIAGNOSES  Final diagnoses:  Syncope, unspecified syncope type     Note: This dictation was prepared with Dragon dictation. Any transcriptional errors that result from this process are unintentional     Nance Pear, MD 08/19/20 2357

## 2020-08-20 ENCOUNTER — Observation Stay: Payer: PPO

## 2020-08-20 ENCOUNTER — Other Ambulatory Visit: Payer: Self-pay

## 2020-08-20 ENCOUNTER — Observation Stay (HOSPITAL_BASED_OUTPATIENT_CLINIC_OR_DEPARTMENT_OTHER)
Admit: 2020-08-20 | Discharge: 2020-08-20 | Disposition: A | Discharge status: Home / Self Care | Payer: PPO | Attending: Internal Medicine | Admitting: Internal Medicine

## 2020-08-20 ENCOUNTER — Encounter: Payer: Self-pay | Admitting: Internal Medicine

## 2020-08-20 DIAGNOSIS — R55 Syncope and collapse: Secondary | ICD-10-CM

## 2020-08-20 DIAGNOSIS — N400 Enlarged prostate without lower urinary tract symptoms: Secondary | ICD-10-CM

## 2020-08-20 DIAGNOSIS — S01412A Laceration without foreign body of left cheek and temporomandibular area, initial encounter: Secondary | ICD-10-CM

## 2020-08-20 DIAGNOSIS — I10 Essential (primary) hypertension: Secondary | ICD-10-CM

## 2020-08-20 DIAGNOSIS — J449 Chronic obstructive pulmonary disease, unspecified: Secondary | ICD-10-CM

## 2020-08-20 LAB — GLUCOSE, CAPILLARY: Glucose-Capillary: 112 mg/dL — ABNORMAL HIGH (ref 70–99)

## 2020-08-20 LAB — ECHOCARDIOGRAM COMPLETE
AV Peak grad: 6.8 mmHg
Ao pk vel: 1.3 m/s
Area-P 1/2: 2.94 cm2
Height: 68 in
S' Lateral: 2.79 cm
Weight: 3067.2 oz

## 2020-08-20 LAB — RESP PANEL BY RT-PCR (FLU A&B, COVID) ARPGX2
Influenza A by PCR: NEGATIVE
Influenza B by PCR: NEGATIVE
SARS Coronavirus 2 by RT PCR: NEGATIVE

## 2020-08-20 LAB — TROPONIN I (HIGH SENSITIVITY): Troponin I (High Sensitivity): 9 ng/L (ref <18)

## 2020-08-20 LAB — CBG MONITORING, ED: Glucose-Capillary: 115 mg/dL — ABNORMAL HIGH (ref 70–99)

## 2020-08-20 MED ORDER — HYDROCODONE-ACETAMINOPHEN 5-325 MG PO TABS: 1.0000 | ORAL_TABLET | ORAL | Status: DC | PRN

## 2020-08-20 MED ORDER — SODIUM CHLORIDE 0.9 % IV BOLUS
500.0000 mL | Freq: Once | INTRAVENOUS | Status: AC
  Administered 2020-08-20: 500 mL via INTRAVENOUS

## 2020-08-20 MED ORDER — ONDANSETRON HCL 4 MG PO TABS: 4.0000 mg | ORAL_TABLET | Freq: Four times a day (QID) | ORAL | Status: DC | PRN

## 2020-08-20 MED ORDER — SODIUM CHLORIDE 0.9% FLUSH
3.0000 mL | Freq: Two times a day (BID) | INTRAVENOUS | Status: DC
  Administered 2020-08-20 (×2): 3 mL via INTRAVENOUS

## 2020-08-20 MED ORDER — ACETAMINOPHEN 650 MG RE SUPP: 650.0000 mg | Freq: Four times a day (QID) | RECTAL | Status: DC | PRN

## 2020-08-20 MED ORDER — ONDANSETRON HCL 4 MG/2ML IJ SOLN: 4.0000 mg | Freq: Four times a day (QID) | INTRAMUSCULAR | Status: DC | PRN

## 2020-08-20 MED ORDER — ENOXAPARIN SODIUM 40 MG/0.4ML ~~LOC~~ SOLN
40.0000 mg | SUBCUTANEOUS | Status: DC
  Filled 2020-08-20: qty 0.4

## 2020-08-20 MED ORDER — ACETAMINOPHEN 325 MG PO TABS: 650.0000 mg | ORAL_TABLET | Freq: Four times a day (QID) | ORAL | Status: DC | PRN

## 2020-08-20 NOTE — H&P (Signed)
History and Physical    Gracyn Santillanes PJK:[id number] DOB: 1941-04-25 DOA: 08/19/2020  PCP: Toni Arthurs, NP   Patient coming from: Home  I have personally briefly reviewed patient's old medical records in [place]  Chief Complaint: Syncope  HPI: Latavious Bitter is a 80 y.o. male with medical history significant for COPD, who presents to the ER following a syncopal episode.  Patient was taking a long walk in his neighborhood and apparently suddenly blacked out  waking up on the ground with a laceration to the left cheek.  He denies preceding symptoms such as chest pain, shortness of breath, lightheadedness, visual disturbance, one-sided weakness numbness or tingling or palpitations.  He was previously in his usual state of health and denies any recent illness such as cough, fever or chills, nausea vomiting or change in bowel habits or dysuria.  He does admit having recently started on a keto pill for weight loss. ED course: On arrival patient alert and oriented.  Afebrile, BP 151/86, pulse 66 with respirations 18 and O2 sat 100% on room air.  Blood work, BMP, CBC and troponin mostly within normal limits. EKG, reviewed and interpreted myself: Sinus at 60 with nonspecific interventricular conduction delay Imaging: Trauma imaging to include CT maxillofacial CT C-spine and CT head with no acute findings Chest x-ray not done  Laceration repair done in the ER.  Hospitalist consulted for overnight observation.  Review of Systems: As per HPI otherwise all other systems on review of systems negative.    Past Medical History:  Diagnosis Date  . ADD (attention deficit disorder)   . Anxiety   . Bladder neck obstruction   . Cancer ([place])    H/O ADENOMATOUS POLYP OF COLON  . COPD (chronic obstructive pulmonary disease) ([place])   . GERD (gastroesophageal reflux disease)   . HTN (hypertension)   . Hyperlipidemia   . Seasonal allergies   . Shortness of breath dyspnea     Past Surgical  History:  Procedure Laterality Date  . adentamous colon polyp    . APPENDECTOMY    . COLONOSCOPY WITH PROPOFOL N/A 01/28/2015   Procedure: COLONOSCOPY WITH PROPOFOL;  Surgeon: Manya Silvas, MD;  Location: [facility] ENDOSCOPY;  Service: Endoscopy;  Laterality: N/A;  . ESOPHAGOGASTRODUODENOSCOPY (EGD) WITH PROPOFOL N/A 01/28/2015   Procedure: ESOPHAGOGASTRODUODENOSCOPY (EGD) WITH PROPOFOL;  Surgeon: Manya Silvas, MD;  Location: [facility] ENDOSCOPY;  Service: Endoscopy;  Laterality: N/A;  . HERNIA REPAIR Left    LEFT INGUINAL  . left humerus debridement Left   . TONSILLECTOMY    . TRIGGER FINGER RELEASE Left 04/15/2015   Procedure: LEFT RING FINGER DUPUYTREN RELEASE;  Surgeon: Leanor Kail, MD;  Location: [place];  Service: Orthopedics;  Laterality: Left;     reports that he has quit smoking. His smoking use included cigarettes. He has a 30.00 pack-year smoking history. He has never used smokeless tobacco. He reports current alcohol use of about 2.0 standard drinks of alcohol per week. He reports that he does not use drugs.  Allergies  Allergen Reactions  . Hydrocodone-Homatropine Nausea And Vomiting    Family History  Problem Relation Age of Onset  . Colon cancer Father   . Prostate cancer Father   . Stroke Mother   . Diabetes Other   . Hyperlipidemia Other   . Glaucoma Other   . Kidney disease Neg Hx   . Kidney cancer Neg Hx   . Bladder Cancer Neg Hx       Prior to  Admission medications   Medication Sig Start Date End Date Taking? Authorizing Provider  albuterol (PROVENTIL HFA;VENTOLIN HFA) 108 (90 BASE) MCG/ACT inhaler Inhale 2 puffs into the lungs every 6 (six) hours as needed for wheezing or shortness of breath.    [provider]  atorvastatin (LIPITOR) 40 MG tablet Take 40 mg by mouth daily. PM    [provider]  cetirizine (ZYRTEC) 10 MG tablet Take 10 mg by mouth daily. AM    [provider]  Cholecalciferol (VITAMIN D3) 50 MCG  (2000 UT) capsule Take 3 capsules daily for 3 months, then reduce to 1 capsule daily thereafter for Vitamin D Deficiency. 10/15/18   [provider]  famotidine (PEPCID) 20 MG tablet Take by mouth. 04/14/18   [provider]  finasteride (PROSCAR) 5 MG tablet Take 1 tablet (5 mg total) by mouth daily. Appt needed for further refills 04/04/20   Zara Council A, PA-C  fluticasone (FLONASE) 50 MCG/ACT nasal spray Place 2 sprays into both nostrils as needed.     [provider]  hydrochlorothiazide (HYDRODIURIL) 12.5 MG tablet Take 12.5 mg by mouth daily. AM    [provider]  tamsulosin (FLOMAX) 0.4 MG CAPS capsule Take 1 capsule (0.4 mg total) by mouth daily. EVE 04/04/20   McGowan, Larene Beach A, PA-C  tiotropium (SPIRIVA) 18 MCG inhalation capsule Place 18 mcg into inhaler and inhale daily. AM    [provider]  vitamin B-12 (CYANOCOBALAMIN) 1000 MCG tablet Take 1,000 mcg by mouth daily.    [provider]    Physical Exam: Vitals:   08/19/20 2145 08/19/20 2147 08/19/20 2200 08/19/20 2230  BP:  (!) 151/86 (!) 153/85 (!) 180/75  Pulse:  66 70 60  Resp:  18 18 18   Temp:  97.8 F (36.6 C)    TempSrc:  Oral    SpO2:  100% 98% 97%  Weight: 88 kg     Height: 5\' 8"  (1.727 m)        Vitals:   08/19/20 2145 08/19/20 2147 08/19/20 2200 08/19/20 2230  BP:  (!) 151/86 (!) 153/85 (!) 180/75  Pulse:  66 70 60  Resp:  18 18 18   Temp:  97.8 F (36.6 C)    TempSrc:  Oral    SpO2:  100% 98% 97%  Weight: 88 kg     Height: 5\' 8"  (1.727 m)         Constitutional: Alert and oriented x 3 . Not in any apparent distress HEENT:      Head: Normocephalic.  Laceration repair left cheek.         Eyes: PERLA, EOMI, Conjunctivae are normal. Sclera is non-icteric.       Mouth/Throat: Mucous membranes are moist.       Neck: Supple with no signs of meningismus. Cardiovascular: Regular rate and rhythm. No murmurs, gallops, or rubs. 2+ symmetrical distal  pulses are present . No JVD. No LE edema Respiratory: Respiratory effort normal .Lungs sounds clear bilaterally. No wheezes, crackles, or rhonchi.  Gastrointestinal: Soft, non tender, and non distended with positive bowel sounds.  Genitourinary: No CVA tenderness. Musculoskeletal: Nontender with normal range of motion in all extremities. No cyanosis, or erythema of extremities. Neurologic:  Face is symmetric. Moving all extremities. No gross focal neurologic deficits . Skin: Skin is warm, dry.  No rash or ulcers Psychiatric: Mood and affect are normal    Labs on Admission: I have personally reviewed following labs and imaging studies  CBC: Recent Labs  Lab 08/19/20 2228  WBC 8.7  NEUTROABS 6.8  HGB 14.2  HCT 41.1  MCV 93.0  PLT [id number]   Basic Metabolic Panel: Recent Labs  Lab 08/19/20 2228  NA 136  K 3.6  CL 104  CO2 23  GLUCOSE 108*  BUN 22  CREATININE 1.17  CALCIUM 9.3   GFR: Estimated Creatinine Clearance: 55.2 mL/min (by C-G formula based on SCr of 1.17 mg/dL). Liver Function Tests: No results for input(s): AST, ALT, ALKPHOS, BILITOT, PROT, ALBUMIN in the last 168 hours. No results for input(s): LIPASE, AMYLASE in the last 168 hours. No results for input(s): AMMONIA in the last 168 hours. Coagulation Profile: No results for input(s): INR, PROTIME in the last 168 hours. Cardiac Enzymes: No results for input(s): CKTOTAL, CKMB, CKMBINDEX, TROPONINI in the last 168 hours. BNP (last 3 results) No results for input(s): PROBNP in the last 8760 hours. HbA1C: No results for input(s): HGBA1C in the last 72 hours. CBG: No results for input(s): GLUCAP in the last 168 hours. Lipid Profile: No results for input(s): CHOL, HDL, LDLCALC, TRIG, CHOLHDL, LDLDIRECT in the last 72 hours. Thyroid Function Tests: No results for input(s): TSH, T4TOTAL, FREET4, T3FREE, THYROIDAB in the last 72 hours. Anemia Panel: No results for input(s): VITAMINB12, FOLATE, FERRITIN, TIBC, IRON,  RETICCTPCT in the last 72 hours. Urine analysis:    Component Value Date/Time   COLORURINE YELLOW (A) 10/07/2015 0005   APPEARANCEUR CLEAR (A) 10/07/2015 0005   LABSPEC 1.008 10/07/2015 0005   PHURINE 6.0 10/07/2015 0005   GLUCOSEU NEGATIVE 10/07/2015 0005   HGBUR NEGATIVE 10/07/2015 0005   BILIRUBINUR NEGATIVE 10/07/2015 0005   KETONESUR NEGATIVE 10/07/2015 0005   PROTEINUR NEGATIVE 10/07/2015 0005   NITRITE NEGATIVE 10/07/2015 0005   LEUKOCYTESUR NEGATIVE 10/07/2015 0005    Radiological Exams on Admission: CT Head Wo Contrast  Result Date: 08/19/2020 CLINICAL DATA:  Fall EXAM: CT HEAD WITHOUT CONTRAST CT MAXILLOFACIAL WITHOUT CONTRAST CT CERVICAL SPINE WITHOUT CONTRAST TECHNIQUE: Multidetector CT imaging of the head, cervical spine, and maxillofacial structures were performed using the standard protocol without intravenous contrast. Multiplanar CT image reconstructions of the cervical spine and maxillofacial structures were also generated. COMPARISON:  None. FINDINGS: CT HEAD FINDINGS Brain: There is no mass, hemorrhage or extra-axial collection. The size and configuration of the ventricles and extra-axial CSF spaces are normal. The brain parenchyma is normal, without evidence of acute or chronic infarction. Vascular: No abnormal hyperdensity of the major intracranial arteries or dural venous sinuses. No intracranial atherosclerosis. Skull: The visualized skull base, calvarium and extracranial soft tissues are normal. CT MAXILLOFACIAL FINDINGS Osseous: --Complex facial fracture types: No LeFort, zygomaticomaxillary complex or nasoorbitoethmoidal fracture. --Simple fracture types: None. --Mandible: No fracture or dislocation. Orbits: The globes are intact. Normal appearance of the intra- and extraconal fat. Symmetric extraocular muscles and optic nerves. Sinuses: No fluid levels or advanced mucosal thickening. Soft tissues: Normal visualized extracranial soft tissues. CT CERVICAL SPINE FINDINGS  Alignment: No static subluxation. Facets are aligned. Occipital condyles and the lateral masses of C1-C2 are aligned. Skull base and vertebrae: No acute fracture. Soft tissues and spinal canal: No prevertebral fluid or swelling. No visible canal hematoma. Disc levels: No advanced spinal canal or neural foraminal stenosis. Upper chest: No pneumothorax, pulmonary nodule or pleural effusion. Other: Normal visualized paraspinal cervical soft tissues. IMPRESSION: 1. No acute intracranial abnormality. 2. No facial fracture. 3. No acute fracture or static subluxation of the cervical spine. Electronically Signed   By: Lennette Bihari  [place] M.D.   On: 08/19/2020 23:28   CT Cervical Spine Wo Contrast  Result Date: 08/19/2020 CLINICAL DATA:  Fall EXAM: CT HEAD WITHOUT CONTRAST CT MAXILLOFACIAL WITHOUT CONTRAST CT CERVICAL SPINE WITHOUT CONTRAST TECHNIQUE: Multidetector CT imaging of the head, cervical spine, and maxillofacial structures were performed using the standard protocol without intravenous contrast. Multiplanar CT image reconstructions of the cervical spine and maxillofacial structures were also generated. COMPARISON:  None. FINDINGS: CT HEAD FINDINGS Brain: There is no mass, hemorrhage or extra-axial collection. The size and configuration of the ventricles and extra-axial CSF spaces are normal. The brain parenchyma is normal, without evidence of acute or chronic infarction. Vascular: No abnormal hyperdensity of the major intracranial arteries or dural venous sinuses. No intracranial atherosclerosis. Skull: The visualized skull base, calvarium and extracranial soft tissues are normal. CT MAXILLOFACIAL FINDINGS Osseous: --Complex facial fracture types: No LeFort, zygomaticomaxillary complex or nasoorbitoethmoidal fracture. --Simple fracture types: None. --Mandible: No fracture or dislocation. Orbits: The globes are intact. Normal appearance of the intra- and extraconal fat. Symmetric extraocular muscles and optic nerves.  Sinuses: No fluid levels or advanced mucosal thickening. Soft tissues: Normal visualized extracranial soft tissues. CT CERVICAL SPINE FINDINGS Alignment: No static subluxation. Facets are aligned. Occipital condyles and the lateral masses of C1-C2 are aligned. Skull base and vertebrae: No acute fracture. Soft tissues and spinal canal: No prevertebral fluid or swelling. No visible canal hematoma. Disc levels: No advanced spinal canal or neural foraminal stenosis. Upper chest: No pneumothorax, pulmonary nodule or pleural effusion. Other: Normal visualized paraspinal cervical soft tissues. IMPRESSION: 1. No acute intracranial abnormality. 2. No facial fracture. 3. No acute fracture or static subluxation of the cervical spine. Electronically Signed   By: Ulyses Jarred M.D.   On: 08/19/2020 23:28   CT Maxillofacial Wo Contrast  Result Date: 08/19/2020 CLINICAL DATA:  Fall EXAM: CT HEAD WITHOUT CONTRAST CT MAXILLOFACIAL WITHOUT CONTRAST CT CERVICAL SPINE WITHOUT CONTRAST TECHNIQUE: Multidetector CT imaging of the head, cervical spine, and maxillofacial structures were performed using the standard protocol without intravenous contrast. Multiplanar CT image reconstructions of the cervical spine and maxillofacial structures were also generated. COMPARISON:  None. FINDINGS: CT HEAD FINDINGS Brain: There is no mass, hemorrhage or extra-axial collection. The size and configuration of the ventricles and extra-axial CSF spaces are normal. The brain parenchyma is normal, without evidence of acute or chronic infarction. Vascular: No abnormal hyperdensity of the major intracranial arteries or dural venous sinuses. No intracranial atherosclerosis. Skull: The visualized skull base, calvarium and extracranial soft tissues are normal. CT MAXILLOFACIAL FINDINGS Osseous: --Complex facial fracture types: No LeFort, zygomaticomaxillary complex or nasoorbitoethmoidal fracture. --Simple fracture types: None. --Mandible: No fracture or  dislocation. Orbits: The globes are intact. Normal appearance of the intra- and extraconal fat. Symmetric extraocular muscles and optic nerves. Sinuses: No fluid levels or advanced mucosal thickening. Soft tissues: Normal visualized extracranial soft tissues. CT CERVICAL SPINE FINDINGS Alignment: No static subluxation. Facets are aligned. Occipital condyles and the lateral masses of C1-C2 are aligned. Skull base and vertebrae: No acute fracture. Soft tissues and spinal canal: No prevertebral fluid or swelling. No visible canal hematoma. Disc levels: No advanced spinal canal or neural foraminal stenosis. Upper chest: No pneumothorax, pulmonary nodule or pleural effusion. Other: Normal visualized paraspinal cervical soft tissues. IMPRESSION: 1. No acute intracranial abnormality. 2. No facial fracture. 3. No acute fracture or static subluxation of the cervical spine. Electronically Signed   By: Ulyses Jarred M.D.   On: 08/19/2020 23:28  Assessment/Plan 80 year old male with history of COPD and without cardiac history, presenting following a syncopal episode with fall sustaining facial laceration.  Had no preceding symptoms.     Syncope and collapse - Patient with syncopal episode while walking - Blood work essentially unremarkable.  Troponin is negative, EKG with borderline bradycardia - CT head with no acute findings. - Follow-up chest x-ray - Etiology uncertain patient reports recently starting on a keto pill for weight loss - Empiric IV hydration - Cardiac monitoring, echocardiogram - Cardiology consult    Chronic obstructive pulmonary disease ([place]) - Not acutely exacerbated - Albuterol as needed    Essential (primary) hypertension - Hold home hydrochlorothiazide for now    BPH (benign prostatic hyperplasia) - Continue finasteride and tamsulosin pending med rec    Laceration of left cheek secondary to fall - Status postrepair in ER - No acute injury on CT maxillofacial and C-spine -  Wound care - Update tetanus status if not already done    DVT prophylaxis: Lovenox  Code Status: full code  Family Communication:  none  Disposition Plan: Back to previous home environment Consults called: cardiology  Status: Observation    Athena Masse MD [facility]     08/20/2020, 12:10 AM

## 2020-08-20 NOTE — Progress Notes (Signed)
*  PRELIMINARY RESULTS* Echocardiogram 2D Echocardiogram has been performed.  Claretta Fraise 08/20/2020, 11:33 AM

## 2020-08-20 NOTE — Plan of Care (Signed)

## 2020-08-20 NOTE — Discharge Instructions (Signed)
Near-Syncope Near-syncope is when you suddenly get weak or dizzy, or you feel like you might pass out (faint). This may also be called presyncope. This is due to a lack of blood flow to the brain. During an episode of near-syncope, you may:  Feel dizzy, weak, or light-headed.  Feel sick to your stomach (nauseous).  See all white or all black.  See spots.  Have cold, clammy skin. This condition is caused by a sudden decrease in blood flow to the brain. This decrease can result from various causes, but most of those causes are not dangerous. However, near-syncope may be a sign of a serious medical problem, so it is important to seek medical care. Follow these instructions at home: Medicines  Take over-the-counter and prescription medicines only as told by your doctor.  If you are taking blood pressure or heart medicine, get up slowly and spend many minutes getting ready to sit and then stand. This can help with dizziness. General instructions  Be aware of any changes in your symptoms.  Talk with your doctor about your symptoms. You may need to have testing to find the cause of your near-syncope.  If you start to feel like you might pass out, lie down right away. Raise (elevate) your feet above the level of your heart. Breathe deeply and steadily. Wait until all of the symptoms are gone.  Have someone stay with you until you feel stable.  Do not drive, use machinery, or play sports until your doctor says it is okay.  Drink enough fluid to keep your pee (urine) pale yellow.  Keep all follow-up visits as told by your doctor. This is important. Get help right away if you:  Have a seizure.  Have pain in your: ? Chest. ? Belly (abdomen). ? Back.  Faint once or more than once.  Have a very bad headache.  Are bleeding from your mouth or butt.  Have black or tarry poop (stool).  Have a very fast or uneven heartbeat (palpitations).  Are mixed up (confused).  Have trouble  walking.  Are very weak.  Have trouble seeing. These symptoms may be an emergency. Do not wait to see if the symptoms will go away. Get medical help right away. Call your local emergency services (911 in the [place] Do not drive yourself to the hospital. Summary  Near-syncope is when you suddenly get weak or dizzy, or you feel like you might pass out (faint).  This condition is caused by a lack of blood flow to the brain.  Near-syncope may be a sign of a serious medical problem, so it is important to seek medical care. This information is not intended to replace advice given to you by your health care provider. Make sure you discuss any questions you have with your health care provider. Document Revised: 08/08/2018 Document Reviewed: 03/05/2018 Elsevier Patient Education  2021 [employer].

## 2020-08-20 NOTE — Consult Note (Signed)
CARDIOLOGY CONSULT NOTE               Patient ID: Arthur Owens MRN: [id number] DOB/AGE: 1940-11-25 80 y.o.  Admit date: 08/19/2020 Referring Physician Judd Gaudier, MD hospitalist Primary Physician Toni Arthurs, NP Primary Cardiologist none Reason for Consultation syncope  HPI: 80 year old male history of COPD hypertension BPH GERD hyperlipidemia dyspnea exertion anxiety reportedly was walking which he normally does daily for about 2 miles and had what appeared to be a syncopal episode that was witnessed.  Patient does not remember the events he had no warning passed out and hit the left side of his face his wife took him inside patient was confused and lethargic and he took several minutes to regain normal mental status after evaluating him because of his disorientation she brought him to the emergency room for evaluation.  ER evaluation was essentially unremarkable except for minor trauma to the left side of his face CTs and laboratories and EKGs were unremarkable patient felt better no previous syncope does not feel lightheaded dizziness.  Patient been taking new pills for keto diet that he was trying and he had not been adequately hydrated and his wife thinks that that is probably why he may have passed  Review of systems complete and found to be negative unless listed above     Past Medical History:  Diagnosis Date  . ADD (attention deficit disorder)   . Anxiety   . Bladder neck obstruction   . Cancer ([place])    H/O ADENOMATOUS POLYP OF COLON  . COPD (chronic obstructive pulmonary disease) ([place])   . GERD (gastroesophageal reflux disease)   . HTN (hypertension)   . Hyperlipidemia   . Seasonal allergies   . Shortness of breath dyspnea     Past Surgical History:  Procedure Laterality Date  . adentamous colon polyp    . APPENDECTOMY    . COLONOSCOPY WITH PROPOFOL N/A 01/28/2015   Procedure: COLONOSCOPY WITH PROPOFOL;  Surgeon: Manya Silvas, MD;  Location: [facility]  ENDOSCOPY;  Service: Endoscopy;  Laterality: N/A;  . ESOPHAGOGASTRODUODENOSCOPY (EGD) WITH PROPOFOL N/A 01/28/2015   Procedure: ESOPHAGOGASTRODUODENOSCOPY (EGD) WITH PROPOFOL;  Surgeon: Manya Silvas, MD;  Location: [facility] ENDOSCOPY;  Service: Endoscopy;  Laterality: N/A;  . HERNIA REPAIR Left    LEFT INGUINAL  . left humerus debridement Left   . TONSILLECTOMY    . TRIGGER FINGER RELEASE Left 04/15/2015   Procedure: LEFT RING FINGER DUPUYTREN RELEASE;  Surgeon: Leanor Kail, MD;  Location: [place];  Service: Orthopedics;  Laterality: Left;    Medications Prior to Admission  Medication Sig Dispense Refill Last Dose  . albuterol (PROVENTIL HFA;VENTOLIN HFA) 108 (90 BASE) MCG/ACT inhaler Inhale 2 puffs into the lungs every 6 (six) hours as needed for wheezing or shortness of breath.   prn at prn  . atorvastatin (LIPITOR) 40 MG tablet Take 40 mg by mouth daily. PM   08/19/2020 at 2100  . cetirizine (ZYRTEC) 10 MG tablet Take 10 mg by mouth daily. AM   08/19/2020 at 0800  . Cholecalciferol (VITAMIN D3) 50 MCG (2000 UT) capsule Take 2,000 Units by mouth daily.   08/19/2020 at 0800  . famotidine (PEPCID) 20 MG tablet Take 20 mg by mouth daily.   08/19/2020 at 0800  . finasteride (PROSCAR) 5 MG tablet Take 1 tablet (5 mg total) by mouth daily. Appt needed for further refills 90 tablet 3 08/19/2020 at 0800  . fluticasone (FLONASE) 50 MCG/ACT nasal spray Place 2 sprays  into both nostrils as needed.    prn at prn  . hydrochlorothiazide (HYDRODIURIL) 12.5 MG tablet Take 12.5 mg by mouth daily. AM   08/19/2020 at 0800  . tamsulosin (FLOMAX) 0.4 MG CAPS capsule Take 1 capsule (0.4 mg total) by mouth daily. EVE (Patient taking differently: Take 0.4 mg by mouth at bedtime.) 90 capsule 3 08/19/2020 at 2100  . tiotropium (SPIRIVA) 18 MCG inhalation capsule Place 18 mcg into inhaler and inhale daily. Pt uses as needed   prn at prn  . vitamin B-12 (CYANOCOBALAMIN) 1000 MCG tablet Take 1,000 mcg by mouth  daily.   08/19/2020 at 0800   Social History   Socioeconomic History  . Marital status: Married    Spouse name: Not on file  . Number of children: Not on file  . Years of education: Not on file  . Highest education level: Not on file  Occupational History  . Not on file  Tobacco Use  . Smoking status: Former Smoker    Packs/day: 2.00    Years: 15.00    Pack years: 30.00    Types: Cigarettes  . Smokeless tobacco: Never Used  . Tobacco comment: quit 2 years  Vaping Use  . Vaping Use: Never used  Substance and Sexual Activity  . Alcohol use: Yes    Alcohol/week: 2.0 standard drinks    Types: 2 Glasses of wine per week  . Drug use: No  . Sexual activity: Not on file  Other Topics Concern  . Not on file  Social History Narrative  . Not on file   Social Determinants of Health   Financial Resource Strain: Not on file  Food Insecurity: Not on file  Transportation Needs: Not on file  Physical Activity: Not on file  Stress: Not on file  Social Connections: Not on file  Intimate Partner Violence: Not on file    Family History  Problem Relation Age of Onset  . Colon cancer Father   . Prostate cancer Father   . Stroke Mother   . Diabetes Other   . Hyperlipidemia Other   . Glaucoma Other   . Kidney disease Neg Hx   . Kidney cancer Neg Hx   . Bladder Cancer Neg Hx       Review of systems complete and found to be negative unless listed above      PHYSICAL EXAM  General: Well developed, well nourished, in no acute distress HEENT:  Normocephalic and atramatic Neck:  No JVD.  Lungs: Clear bilaterally to auscultation and percussion. Heart: HRRR . Normal S1 and S2 without gallops or murmurs.  Abdomen: Bowel sounds are positive, abdomen soft and non-tender  Msk:  Back normal, normal gait. Normal strength and tone for age. Extremities: No clubbing, cyanosis or edema.   Neuro: Alert and oriented X 3. Psych:  Good affect, responds appropriately  Labs:   Lab Results   Component Value Date   WBC 8.7 08/19/2020   HGB 14.2 08/19/2020   HCT 41.1 08/19/2020   MCV 93.0 08/19/2020   PLT 200 08/19/2020    Recent Labs  Lab 08/19/20 2228  NA 136  K 3.6  CL 104  CO2 23  BUN 22  CREATININE 1.17  CALCIUM 9.3  GLUCOSE 108*   No results found for: CKTOTAL, CKMB, CKMBINDEX, TROPONINI No results found for: CHOL No results found for: HDL No results found for: LDLCALC No results found for: TRIG No results found for: CHOLHDL No results found for: LDLDIRECT  Radiology: CT Head Wo Contrast  Result Date: 08/19/2020 CLINICAL DATA:  Fall EXAM: CT HEAD WITHOUT CONTRAST CT MAXILLOFACIAL WITHOUT CONTRAST CT CERVICAL SPINE WITHOUT CONTRAST TECHNIQUE: Multidetector CT imaging of the head, cervical spine, and maxillofacial structures were performed using the standard protocol without intravenous contrast. Multiplanar CT image reconstructions of the cervical spine and maxillofacial structures were also generated. COMPARISON:  None. FINDINGS: CT HEAD FINDINGS Brain: There is no mass, hemorrhage or extra-axial collection. The size and configuration of the ventricles and extra-axial CSF spaces are normal. The brain parenchyma is normal, without evidence of acute or chronic infarction. Vascular: No abnormal hyperdensity of the major intracranial arteries or dural venous sinuses. No intracranial atherosclerosis. Skull: The visualized skull base, calvarium and extracranial soft tissues are normal. CT MAXILLOFACIAL FINDINGS Osseous: --Complex facial fracture types: No LeFort, zygomaticomaxillary complex or nasoorbitoethmoidal fracture. --Simple fracture types: None. --Mandible: No fracture or dislocation. Orbits: The globes are intact. Normal appearance of the intra- and extraconal fat. Symmetric extraocular muscles and optic nerves. Sinuses: No fluid levels or advanced mucosal thickening. Soft tissues: Normal visualized extracranial soft tissues. CT CERVICAL SPINE FINDINGS Alignment:  No static subluxation. Facets are aligned. Occipital condyles and the lateral masses of C1-C2 are aligned. Skull base and vertebrae: No acute fracture. Soft tissues and spinal canal: No prevertebral fluid or swelling. No visible canal hematoma. Disc levels: No advanced spinal canal or neural foraminal stenosis. Upper chest: No pneumothorax, pulmonary nodule or pleural effusion. Other: Normal visualized paraspinal cervical soft tissues. IMPRESSION: 1. No acute intracranial abnormality. 2. No facial fracture. 3. No acute fracture or static subluxation of the cervical spine. Electronically Signed   By: Ulyses Jarred M.D.   On: 08/19/2020 23:28   CT Cervical Spine Wo Contrast  Result Date: 08/19/2020 CLINICAL DATA:  Fall EXAM: CT HEAD WITHOUT CONTRAST CT MAXILLOFACIAL WITHOUT CONTRAST CT CERVICAL SPINE WITHOUT CONTRAST TECHNIQUE: Multidetector CT imaging of the head, cervical spine, and maxillofacial structures were performed using the standard protocol without intravenous contrast. Multiplanar CT image reconstructions of the cervical spine and maxillofacial structures were also generated. COMPARISON:  None. FINDINGS: CT HEAD FINDINGS Brain: There is no mass, hemorrhage or extra-axial collection. The size and configuration of the ventricles and extra-axial CSF spaces are normal. The brain parenchyma is normal, without evidence of acute or chronic infarction. Vascular: No abnormal hyperdensity of the major intracranial arteries or dural venous sinuses. No intracranial atherosclerosis. Skull: The visualized skull base, calvarium and extracranial soft tissues are normal. CT MAXILLOFACIAL FINDINGS Osseous: --Complex facial fracture types: No LeFort, zygomaticomaxillary complex or nasoorbitoethmoidal fracture. --Simple fracture types: None. --Mandible: No fracture or dislocation. Orbits: The globes are intact. Normal appearance of the intra- and extraconal fat. Symmetric extraocular muscles and optic nerves. Sinuses: No  fluid levels or advanced mucosal thickening. Soft tissues: Normal visualized extracranial soft tissues. CT CERVICAL SPINE FINDINGS Alignment: No static subluxation. Facets are aligned. Occipital condyles and the lateral masses of C1-C2 are aligned. Skull base and vertebrae: No acute fracture. Soft tissues and spinal canal: No prevertebral fluid or swelling. No visible canal hematoma. Disc levels: No advanced spinal canal or neural foraminal stenosis. Upper chest: No pneumothorax, pulmonary nodule or pleural effusion. Other: Normal visualized paraspinal cervical soft tissues. IMPRESSION: 1. No acute intracranial abnormality. 2. No facial fracture. 3. No acute fracture or static subluxation of the cervical spine. Electronically Signed   By: Ulyses Jarred M.D.   On: 08/19/2020 23:28   DG Chest Port 1 View  Result Date: 08/20/2020 CLINICAL  DATA:  Syncope EXAM: PORTABLE CHEST 1 VIEW COMPARISON:  03/14/2010 FINDINGS: The heart size and mediastinal contours are within normal limits. Both lungs are clear. The visualized skeletal structures are unremarkable. IMPRESSION: No active disease. Electronically Signed   By: Ulyses Jarred M.D.   On: 08/20/2020 00:56   ECHOCARDIOGRAM COMPLETE  Result Date: 08/20/2020    ECHOCARDIOGRAM REPORT   Patient Name:   VALTON HAPP Date of Exam: 08/20/2020 Medical Rec #:  [id number]      Height:       68.0 in Accession #:    [id number]     Weight:       191.7 lb Date of Birth:  Aug 28, 1940      BSA:          2.007 m Patient Age:    81 years       BP:           132/59 mmHg Patient Gender: M              HR:           64 bpm. Exam Location:  [facility] Procedure: 2D Echo Indications:     Syncope R55  History:         Patient has no prior history of Echocardiogram examinations.  Sonographer:     Arville Go RDCS Referring Phys:  [id number] Athena Masse Diagnosing Phys: Yolonda Kida MD  Sonographer Comments: Technically difficult study due to poor echo windows and suboptimal parasternal  window. IMPRESSIONS  1. Normal LVF Normal Wall Motion EF=55-60%.  2. Left ventricular ejection fraction, by estimation, is 55 to 60%. The left ventricle has normal function. The left ventricle has no regional wall motion abnormalities. Left ventricular diastolic parameters are consistent with Grade I diastolic dysfunction (impaired relaxation).  3. Right ventricular systolic function is normal. The right ventricular size is normal.  4. The mitral valve is normal in structure. Trivial mitral valve regurgitation.  5. The aortic valve is normal in structure. Aortic valve regurgitation is trivial. Conclusion(s)/Recommendation(s): Normal biventricular function without evidence of hemodynamically significant valvular heart disease. FINDINGS  Left Ventricle: Left ventricular ejection fraction, by estimation, is 55 to 60%. The left ventricle has normal function. The left ventricle has no regional wall motion abnormalities. The left ventricular internal cavity size was normal in size. There is  no left ventricular hypertrophy. Left ventricular diastolic parameters are consistent with Grade I diastolic dysfunction (impaired relaxation). Right Ventricle: The right ventricular size is normal. No increase in right ventricular wall thickness. Right ventricular systolic function is normal. Left Atrium: Left atrial size was normal in size. Right Atrium: Right atrial size was normal in size. Pericardium: There is no evidence of pericardial effusion. Mitral Valve: The mitral valve is normal in structure. Trivial mitral valve regurgitation. Tricuspid Valve: The tricuspid valve is normal in structure. Tricuspid valve regurgitation is trivial. Aortic Valve: The aortic valve is normal in structure. Aortic valve regurgitation is trivial. Aortic valve peak gradient measures 6.8 mmHg. Pulmonic Valve: The pulmonic valve was normal in structure. Pulmonic valve regurgitation is not visualized. Aorta: The ascending aorta was not well  visualized. IAS/Shunts: No atrial level shunt detected by color flow Doppler. Additional Comments: Normal LVF Normal Wall Motion EF=55-60%.  LEFT VENTRICLE PLAX 2D LVIDd:         3.90 cm Diastology LVIDs:         2.79 cm LV e' medial:    6.85 cm/s LV PW:  1.23 cm LV E/e' medial:  8.4 LV IVS:        1.15 cm LV e' lateral:   11.20 cm/s                        LV E/e' lateral: 5.1  RIGHT VENTRICLE RV Basal diam:  3.37 cm RV S prime:     14.50 cm/s TAPSE (M-mode): 2.3 cm LEFT ATRIUM             Index       RIGHT ATRIUM           Index LA diam:        3.40 cm 1.69 cm/m  RA Area:     18.50 cm LA Vol (A2C):   34.5 ml 17.19 ml/m RA Volume:   54.30 ml  27.05 ml/m LA Vol (A4C):   27.8 ml 13.85 ml/m LA Biplane Vol: 31.3 ml 15.59 ml/m  AORTIC VALVE              PULMONIC VALVE AV Vmax:      130.00 cm/s PV Vmax:       0.75 m/s AV Peak Grad: 6.8 mmHg    PV Peak grad:  2.2 mmHg LVOT Vmax:    79.00 cm/s LVOT Vmean:   53.300 cm/s LVOT VTI:     0.160 m MITRAL VALVE               TRICUSPID VALVE MV Area (PHT): 2.94 cm    TV Peak grad:   19.2 mmHg MV Decel Time: 258 msec    TV Vmax:        2.19 m/s MV E velocity: 57.40 cm/s MV A velocity: 76.30 cm/s  SHUNTS MV E/A ratio:  0.75        Systemic VTI: 0.16 m Yolonda Kida MD Electronically signed by Yolonda Kida MD Signature Date/Time: 08/20/2020/12:12:15 PM    Final    CT Maxillofacial Wo Contrast  Result Date: 08/19/2020 CLINICAL DATA:  Fall EXAM: CT HEAD WITHOUT CONTRAST CT MAXILLOFACIAL WITHOUT CONTRAST CT CERVICAL SPINE WITHOUT CONTRAST TECHNIQUE: Multidetector CT imaging of the head, cervical spine, and maxillofacial structures were performed using the standard protocol without intravenous contrast. Multiplanar CT image reconstructions of the cervical spine and maxillofacial structures were also generated. COMPARISON:  None. FINDINGS: CT HEAD FINDINGS Brain: There is no mass, hemorrhage or extra-axial collection. The size and configuration of the ventricles  and extra-axial CSF spaces are normal. The brain parenchyma is normal, without evidence of acute or chronic infarction. Vascular: No abnormal hyperdensity of the major intracranial arteries or dural venous sinuses. No intracranial atherosclerosis. Skull: The visualized skull base, calvarium and extracranial soft tissues are normal. CT MAXILLOFACIAL FINDINGS Osseous: --Complex facial fracture types: No LeFort, zygomaticomaxillary complex or nasoorbitoethmoidal fracture. --Simple fracture types: None. --Mandible: No fracture or dislocation. Orbits: The globes are intact. Normal appearance of the intra- and extraconal fat. Symmetric extraocular muscles and optic nerves. Sinuses: No fluid levels or advanced mucosal thickening. Soft tissues: Normal visualized extracranial soft tissues. CT CERVICAL SPINE FINDINGS Alignment: No static subluxation. Facets are aligned. Occipital condyles and the lateral masses of C1-C2 are aligned. Skull base and vertebrae: No acute fracture. Soft tissues and spinal canal: No prevertebral fluid or swelling. No visible canal hematoma. Disc levels: No advanced spinal canal or neural foraminal stenosis. Upper chest: No pneumothorax, pulmonary nodule or pleural effusion. Other: Normal visualized paraspinal cervical soft tissues. IMPRESSION: 1. No acute intracranial  abnormality. 2. No facial fracture. 3. No acute fracture or static subluxation of the cervical spine. Electronically Signed   By: Ulyses Jarred M.D.   On: 08/19/2020 23:28    EKG: Normal sinus rhythm rate of 60 nonspecific ST-T wave changes  ASSESSMENT AND PLAN:  Syncope Hypertension COPD Hyperlipidemia Shortness of breath Hyperlipidemia GERD Minor facial injury . Plan Agree with admit for telemetry Agree rule out myocardial infarction EKG and troponins Inhalers as needed for COPD Continue hypertension management and control continue HCTZ probably add amlodipine or ACE or ARB Consider neurology evaluation as an  etiology syncope including carotid Dopplers Maintain adequate hydration Evaluate for orthostatic blood pressure Echocardiogram unremarkable Consider early discharge follow-up as an outpatient with cardiology    Signed: Yolonda Kida MD 08/20/2020, 12:48 PM

## 2020-08-20 NOTE — Discharge Summary (Addendum)
[facility] NAME: Arthur Owens    MR#:  [id number]  DATE OF BIRTH:  1940/07/15  DATE OF ADMISSION:  08/19/2020   ADMITTING PHYSICIAN: Athena Masse, MD  DATE OF DISCHARGE: 08/20/2020  1:05 PM  PRIMARY CARE PHYSICIAN: Toni Arthurs, NP   ADMISSION DIAGNOSIS:  Syncope and collapse [R55] Syncope, unspecified syncope type [R55] DISCHARGE DIAGNOSIS:  Principal Problem:   Syncope and collapse Active Problems:   Chronic obstructive pulmonary disease ([place])   Essential (primary) hypertension   BPH (benign prostatic hyperplasia)   Laceration of left cheek  SECONDARY DIAGNOSIS:   Past Medical History:  Diagnosis Date  . ADD (attention deficit disorder)   . Anxiety   . Bladder neck obstruction   . Cancer ([place])    H/O ADENOMATOUS POLYP OF COLON  . COPD (chronic obstructive pulmonary disease) ([place])   . GERD (gastroesophageal reflux disease)   . HTN (hypertension)   . Hyperlipidemia   . Seasonal allergies   . Shortness of breath dyspnea    HOSPITAL COURSE:  80 year old male with history of COPD and without cardiac history, presenting following a syncopal episode with fall sustaining facial laceration.  Had no preceding symptoms.     Syncope and collapse - 1st episode - Patient with syncopal episode while walking - Blood work essentially unremarkable.  Troponin is negative, EKG with borderline bradycardia - CT head with no acute findings. - chest x-ray wnl, BP in good control here. No hypotension. - possible etiology uncertain but possibly from keto pills - recently started on a keto pill for weight loss - could have led to transient hypotension from dehydration. He has some drop in orthostatic BP from lying to sitting posture. Explained importance of staying hydrated and change position slowly to avoid feeling dizzy although he denies any postural symptoms. - Improved with IV hydration. Feels well and back to baseline. Ambulated here without  recurrence of symptoms. - normal echocardiogram, no arrythmias reported on tele - Cardiology recommends outpt f/up - consider neuro eval as an outpt if symptoms recur    Chronic obstructive pulmonary disease ([place]) - Not acutely exacerbated - Albuterol as needed    Essential (primary) hypertension - controlled    BPH (benign prostatic hyperplasia) - Continue home meds    Laceration of left cheek secondary to fall - Status postrepair in ER - f/up with PCP/surgery for sutural removal if need - No acute injury on CT maxillofacial and C-spine  DISCHARGE CONDITIONS:  stable CONSULTS OBTAINED:  Treatment Team:  Yolonda Kida, MD DRUG ALLERGIES:   Allergies  Allergen Reactions  . Hydrocodone-Homatropine Nausea And Vomiting   DISCHARGE MEDICATIONS:   Allergies as of 08/20/2020      Reactions   Hydrocodone-homatropine Nausea And Vomiting      Medication List    TAKE these medications   albuterol 108 (90 Base) MCG/ACT inhaler Commonly known as: VENTOLIN HFA Inhale 2 puffs into the lungs every 6 (six) hours as needed for wheezing or shortness of breath.   atorvastatin 40 MG tablet Commonly known as: LIPITOR Take 40 mg by mouth daily. PM   cetirizine 10 MG tablet Commonly known as: ZYRTEC Take 10 mg by mouth daily. AM   famotidine 20 MG tablet Commonly known as: PEPCID Take 20 mg by mouth daily.   finasteride 5 MG tablet Commonly known as: PROSCAR Take 1 tablet (5 mg total) by mouth daily. Appt needed for further refills   fluticasone 50 MCG/ACT  nasal spray Commonly known as: FLONASE Place 2 sprays into both nostrils as needed.   hydrochlorothiazide 12.5 MG tablet Commonly known as: HYDRODIURIL Take 12.5 mg by mouth daily. AM   tamsulosin 0.4 MG Caps capsule Commonly known as: FLOMAX Take 1 capsule (0.4 mg total) by mouth daily. EVE What changed:   when to take this  additional instructions   tiotropium 18 MCG inhalation capsule Commonly known  as: SPIRIVA Place 18 mcg into inhaler and inhale daily. Pt uses as needed   vitamin B-12 1000 MCG tablet Commonly known as: CYANOCOBALAMIN Take 1,000 mcg by mouth daily.   Vitamin D3 50 MCG (2000 UT) capsule Take 2,000 Units by mouth daily.      DISCHARGE INSTRUCTIONS:   DIET:  Cardiac diet DISCHARGE CONDITION:  Good ACTIVITY:  Activity as tolerated OXYGEN:  Home Oxygen: No.  Oxygen Delivery: room air DISCHARGE LOCATION:  home   If you experience worsening of your admission symptoms, develop shortness of breath, life threatening emergency, suicidal or homicidal thoughts you must seek medical attention immediately by calling 911 or calling your MD immediately  if symptoms less severe.  You Must read complete instructions/literature along with all the possible adverse reactions/side effects for all the Medicines you take and that have been prescribed to you. Take any new Medicines after you have completely understood and accpet all the possible adverse reactions/side effects.   Please note  You were cared for by a hospitalist during your hospital stay. If you have any questions about your discharge medications or the care you received while you were in the hospital after you are discharged, you can call the unit and asked to speak with the hospitalist on call if the hospitalist that took care of you is not available. Once you are discharged, your primary care physician will handle any further medical issues. Please note that NO REFILLS for any discharge medications will be authorized once you are discharged, as it is imperative that you return to your primary care physician (or establish a relationship with a primary care physician if you do not have one) for your aftercare needs so that they can reassess your need for medications and monitor your lab values.    On the day of Discharge:  VITAL SIGNS:  Blood pressure 139/75, pulse 65, temperature 98.4 F (36.9 C), temperature  source Oral, resp. rate 18, height 5\' 8"  (1.727 m), weight 87 kg, SpO2 90 %. PHYSICAL EXAMINATION:  GENERAL:  80 y.o.-year-old patient lying in the bed with no acute distress.  EYES: Pupils equal, round, reactive to light and accommodation. No scleral icterus. Extraocular muscles intact.  HEENT: Head atraumatic, normocephalic. Oropharynx and nasopharynx clear.  NECK:  Supple, no jugular venous distention. No thyroid enlargement, no tenderness.  LUNGS: Normal breath sounds bilaterally, no wheezing, rales,rhonchi or crepitation. No use of accessory muscles of respiration.  CARDIOVASCULAR: S1, S2 normal. No murmurs, rubs, or gallops.  ABDOMEN: Soft, non-tender, non-distended. Bowel sounds present. No organomegaly or mass.  EXTREMITIES: No pedal edema, cyanosis, or clubbing.  NEUROLOGIC: Cranial nerves II through XII are intact. Muscle strength 5/5 in all extremities. Sensation intact. Gait not checked.  PSYCHIATRIC: The patient is alert and oriented x 3.  SKIN: No obvious rash, lesion, or ulcer.  DATA REVIEW:   CBC Recent Labs  Lab 08/19/20 2228  WBC 8.7  HGB 14.2  HCT 41.1  PLT 200    Chemistries  Recent Labs  Lab 08/19/20 2228  NA 136  K 3.6  CL 104  CO2 23  GLUCOSE 108*  BUN 22  CREATININE 1.17  CALCIUM 9.3     Outpatient follow-up  Follow-up Information    Toni Arthurs, NP. Schedule an appointment as soon as possible for a visit in 1 week(s).   Specialty: Family Medicine Contact information: [place] [address] [phone]        Yolonda Kida, MD. Schedule an appointment as soon as possible for a visit in 2 week(s).   Specialties: Cardiology, Internal Medicine Contact information: [place] [address] [phone]                  Management plans discussed with the patient, family and they are in agreement.  CODE STATUS: Full Code   TOTAL TIME TAKING CARE OF THIS PATIENT: 45 minutes.     Max Sane M.D on 08/20/2020 at 4:24 PM  [facility]   CC: Primary care physician; Toni Arthurs, NP   Note: This dictation was prepared with Dragon dictation along with smaller phrase technology. Any transcriptional errors that result from this process are unintentional.

## 2020-08-29 DIAGNOSIS — S0181XD Laceration without foreign body of other part of head, subsequent encounter: Secondary | ICD-10-CM | POA: Diagnosis not present

## 2020-08-29 DIAGNOSIS — Z4802 Encounter for removal of sutures: Secondary | ICD-10-CM | POA: Diagnosis not present

## 2020-09-05 DIAGNOSIS — J449 Chronic obstructive pulmonary disease, unspecified: Secondary | ICD-10-CM | POA: Diagnosis not present

## 2020-09-05 DIAGNOSIS — R918 Other nonspecific abnormal finding of lung field: Secondary | ICD-10-CM | POA: Diagnosis not present

## 2020-09-05 DIAGNOSIS — K219 Gastro-esophageal reflux disease without esophagitis: Secondary | ICD-10-CM | POA: Diagnosis not present

## 2020-09-05 DIAGNOSIS — I10 Essential (primary) hypertension: Secondary | ICD-10-CM | POA: Diagnosis not present

## 2020-09-05 DIAGNOSIS — R55 Syncope and collapse: Secondary | ICD-10-CM | POA: Diagnosis not present

## 2020-09-05 DIAGNOSIS — E669 Obesity, unspecified: Secondary | ICD-10-CM | POA: Diagnosis not present

## 2020-09-07 DIAGNOSIS — Z859 Personal history of malignant neoplasm, unspecified: Secondary | ICD-10-CM | POA: Diagnosis not present

## 2020-09-07 DIAGNOSIS — L578 Other skin changes due to chronic exposure to nonionizing radiation: Secondary | ICD-10-CM | POA: Diagnosis not present

## 2020-09-07 DIAGNOSIS — Z86018 Personal history of other benign neoplasm: Secondary | ICD-10-CM | POA: Diagnosis not present

## 2020-09-23 ENCOUNTER — Encounter: Payer: Self-pay | Admitting: *Deleted

## 2020-09-23 ENCOUNTER — Ambulatory Visit: Payer: PPO | Admitting: Anesthesiology

## 2020-09-23 ENCOUNTER — Ambulatory Visit
Admission: RE | Admit: 2020-09-23 | Discharge: 2020-09-23 | Disposition: A | Discharge status: Home / Self Care | Payer: PPO | Attending: Gastroenterology | Admitting: Gastroenterology

## 2020-09-23 ENCOUNTER — Encounter
Admission: RE | Disposition: A | Discharge status: Home / Self Care | Payer: Self-pay | Source: Home / Self Care | Attending: Gastroenterology

## 2020-09-23 DIAGNOSIS — Z79899 Other long term (current) drug therapy: Secondary | ICD-10-CM | POA: Diagnosis not present

## 2020-09-23 DIAGNOSIS — Z885 Allergy status to narcotic agent status: Secondary | ICD-10-CM | POA: Insufficient documentation

## 2020-09-23 DIAGNOSIS — K635 Polyp of colon: Secondary | ICD-10-CM | POA: Insufficient documentation

## 2020-09-23 DIAGNOSIS — N400 Enlarged prostate without lower urinary tract symptoms: Secondary | ICD-10-CM | POA: Insufficient documentation

## 2020-09-23 DIAGNOSIS — Z1211 Encounter for screening for malignant neoplasm of colon: Secondary | ICD-10-CM | POA: Insufficient documentation

## 2020-09-23 DIAGNOSIS — I10 Essential (primary) hypertension: Secondary | ICD-10-CM | POA: Insufficient documentation

## 2020-09-23 DIAGNOSIS — Z86010 Personal history of colonic polyps: Secondary | ICD-10-CM | POA: Diagnosis not present

## 2020-09-23 DIAGNOSIS — K5790 Diverticulosis of intestine, part unspecified, without perforation or abscess without bleeding: Secondary | ICD-10-CM | POA: Diagnosis not present

## 2020-09-23 DIAGNOSIS — Z800 Family history of malignant neoplasm of digestive organs: Secondary | ICD-10-CM | POA: Diagnosis not present

## 2020-09-23 DIAGNOSIS — K5730 Diverticulosis of large intestine without perforation or abscess without bleeding: Secondary | ICD-10-CM | POA: Insufficient documentation

## 2020-09-23 DIAGNOSIS — Z859 Personal history of malignant neoplasm, unspecified: Secondary | ICD-10-CM | POA: Diagnosis not present

## 2020-09-23 DIAGNOSIS — E785 Hyperlipidemia, unspecified: Secondary | ICD-10-CM | POA: Diagnosis not present

## 2020-09-23 DIAGNOSIS — J449 Chronic obstructive pulmonary disease, unspecified: Secondary | ICD-10-CM | POA: Diagnosis not present

## 2020-09-23 HISTORY — PX: COLONOSCOPY WITH PROPOFOL: SHX5780

## 2020-09-23 SURGERY — COLONOSCOPY WITH PROPOFOL
Anesthesia: General

## 2020-09-23 MED ORDER — PROPOFOL 500 MG/50ML IV EMUL
INTRAVENOUS | Status: AC
  Filled 2020-09-23: qty 50

## 2020-09-23 MED ORDER — LIDOCAINE HCL (CARDIAC) PF 100 MG/5ML IV SOSY
PREFILLED_SYRINGE | INTRAVENOUS | Status: DC | PRN
  Administered 2020-09-23: 40 mg via INTRAVENOUS

## 2020-09-23 MED ORDER — PROPOFOL 500 MG/50ML IV EMUL
INTRAVENOUS | Status: DC | PRN
  Administered 2020-09-23: 150 ug/kg/min via INTRAVENOUS

## 2020-09-23 MED ORDER — LACTATED RINGERS IV SOLN: INTRAVENOUS | Status: DC | PRN

## 2020-09-23 MED ORDER — PROPOFOL 10 MG/ML IV BOLUS
INTRAVENOUS | Status: DC | PRN
  Administered 2020-09-23: 70 mg via INTRAVENOUS

## 2020-09-23 MED ORDER — LIDOCAINE HCL (PF) 2 % IJ SOLN
INTRAMUSCULAR | Status: AC
  Filled 2020-09-23: qty 2

## 2020-09-23 MED ORDER — PROPOFOL 10 MG/ML IV BOLUS
INTRAVENOUS | Status: AC
  Filled 2020-09-23: qty 20

## 2020-09-23 NOTE — Interval H&P Note (Signed)
History and Physical Interval Note:  09/23/2020 8:22 AM  Arthur Owens  has presented today for surgery, with the diagnosis of HX ADEN POLYP.  The various methods of treatment have been discussed with the patient and family. After consideration of risks, benefits and other options for treatment, the patient has consented to  Procedure(s): COLONOSCOPY WITH PROPOFOL (N/A) as a surgical intervention.  The patient's history has been reviewed, patient examined, no change in status, stable for surgery.  I have reviewed the patient's chart and labs.  Questions were answered to the patient's satisfaction.     [name]  Ok to proceed with colonoscopy

## 2020-09-23 NOTE — Transfer of Care (Signed)
Immediate Anesthesia Transfer of Care Note  Patient: Arthur Owens  Procedure(s) Performed: COLONOSCOPY WITH PROPOFOL (N/A )  Patient Location: PACU  Anesthesia Type:MAC  Level of Consciousness: awake, drowsy and patient cooperative  Airway & Oxygen Therapy: Patient Spontanous Breathing  Post-op Assessment: Report given to RN and Post -op Vital signs reviewed and stable  Post vital signs: Reviewed and stable  Last Vitals:  Vitals Value Taken Time  BP 93/59 09/23/20 0859  Temp 36.2 C 09/23/20 0850  Pulse 67 09/23/20 0901  Resp 18 09/23/20 0901  SpO2 92 % 09/23/20 0901  Vitals shown include unvalidated device data.  Last Pain:  Vitals:   09/23/20 0850  TempSrc: Tympanic  PainSc:          Complications: No complications documented.

## 2020-09-23 NOTE — Anesthesia Postprocedure Evaluation (Signed)
Anesthesia Post Note  Patient: Akaash Vandewater  Procedure(s) Performed: COLONOSCOPY WITH PROPOFOL (N/A )  Patient location during evaluation: Phase II Anesthesia Type: General Level of consciousness: awake and alert, awake and oriented Pain management: pain level controlled Vital Signs Assessment: post-procedure vital signs reviewed and stable Respiratory status: spontaneous breathing, nonlabored ventilation and respiratory function stable Cardiovascular status: blood pressure returned to baseline and stable Postop Assessment: no apparent nausea or vomiting Anesthetic complications: no   No complications documented.   Last Vitals:  Vitals:   09/23/20 0906 09/23/20 0910  BP: 111/67 119/75  Pulse: 64 61  Resp: 15 17  Temp:    SpO2: (!) 88% 90%    Last Pain:  Vitals:   09/23/20 0859  TempSrc: Temporal  PainSc:                  Phill Mutter

## 2020-09-23 NOTE — Anesthesia Preprocedure Evaluation (Signed)
Anesthesia Evaluation  Patient identified by MRN, date of birth, ID band Patient awake    Reviewed: Allergy & Precautions, NPO status , Patient's Chart, lab work & pertinent test results  Airway Mallampati: II  TM Distance: >3 FB Neck ROM: Full    Dental  (+) Poor Dentition   Pulmonary shortness of breath and with exertion, COPD,  COPD inhaler, former smoker,    Pulmonary exam normal        Cardiovascular hypertension, Pt. on medications negative cardio ROS Normal cardiovascular exam     Neuro/Psych PSYCHIATRIC DISORDERS Anxiety negative neurological ROS     GI/Hepatic Neg liver ROS, Bowel prep,GERD  ,  Endo/Other  negative endocrine ROS  Renal/GU negative Renal ROS  negative genitourinary   Musculoskeletal negative musculoskeletal ROS (+)   Abdominal   Peds negative pediatric ROS (+)  Hematology negative hematology ROS (+)   Anesthesia Other Findings Syncope and collapse (Primary Dx);  Chronic obstructive pulmonary disease, unspecified COPD type (CMS-HCC);  Essential hypertension;  Gastroesophageal reflux disease, unspecified whether esophagitis present;  Lung mass;  Obesity, unspecified classification, unspecified obesity type, unspecified whether serious comorbidity present    Reproductive/Obstetrics negative OB ROS                            Anesthesia Physical Anesthesia Plan  ASA: II  Anesthesia Plan: General   Post-op Pain Management:    Induction: Intravenous  PONV Risk Score and Plan: 1 and Propofol infusion and TIVA  Airway Management Planned: Natural Airway and Nasal Cannula  Additional Equipment:   Intra-op Plan:   Post-operative Plan:   Informed Consent: I have reviewed the patients History and Physical, chart, labs and discussed the procedure including the risks, benefits and alternatives for the proposed anesthesia with the patient or authorized  representative who has indicated his/her understanding and acceptance.       Plan Discussed with: CRNA, Anesthesiologist and Surgeon  Anesthesia Plan Comments:         Anesthesia Quick Evaluation

## 2020-09-23 NOTE — H&P (Signed)
Outpatient short stay form Pre-procedure 09/23/2020 8:20 AM Arthur Miyamoto MD, MPH  Primary Physician: NP Payton Mccallum  Reason for visit:  Surveillance colonoscopy  History of present illness:   80 y/o gentleman with history of polyps, hypertension, and BPH. Notes list father with colon cancer but on interview this is more likely to be prostate cancer. No blood thinners. History of appendectomy and hernia surgery.    No current facility-administered medications for this encounter.  Medications Prior to Admission  Medication Sig Dispense Refill Last Dose  . atorvastatin (LIPITOR) 40 MG tablet Take 40 mg by mouth daily. PM   09/22/2020 at Unknown time  . cetirizine (ZYRTEC) 10 MG tablet Take 10 mg by mouth daily. AM   09/22/2020 at Unknown time  . Cholecalciferol (VITAMIN D3) 50 MCG (2000 UT) capsule Take 2,000 Units by mouth daily.   09/22/2020 at Unknown time  . famotidine (PEPCID) 20 MG tablet Take 20 mg by mouth daily.   Past Week at Unknown time  . finasteride (PROSCAR) 5 MG tablet Take 1 tablet (5 mg total) by mouth daily. Appt needed for further refills 90 tablet 3 09/23/2020 at 0730  . fluticasone (FLONASE) 50 MCG/ACT nasal spray Place 2 sprays into both nostrils as needed.    Past Week at Unknown time  . hydrochlorothiazide (HYDRODIURIL) 12.5 MG tablet Take 12.5 mg by mouth daily. AM   09/23/2020 at 0730  . tamsulosin (FLOMAX) 0.4 MG CAPS capsule Take 1 capsule (0.4 mg total) by mouth daily. EVE (Patient taking differently: Take 0.4 mg by mouth at bedtime.) 90 capsule 3 Past Week at Unknown time  . vitamin B-12 (CYANOCOBALAMIN) 1000 MCG tablet Take 1,000 mcg by mouth daily.   09/22/2020 at Unknown time  . albuterol (PROVENTIL HFA;VENTOLIN HFA) 108 (90 BASE) MCG/ACT inhaler Inhale 2 puffs into the lungs every 6 (six) hours as needed for wheezing or shortness of breath.   prn  . tiotropium (SPIRIVA) 18 MCG inhalation capsule Place 18 mcg into inhaler and inhale daily. Pt uses as needed   prn      Allergies  Allergen Reactions  . Hydrocodone Bit-Homatrop Mbr Nausea And Vomiting     Past Medical History:  Diagnosis Date  . ADD (attention deficit disorder)   . Anxiety   . Bladder neck obstruction   . Cancer ([place])    H/O ADENOMATOUS POLYP OF COLON  . COPD (chronic obstructive pulmonary disease) ([place])   . GERD (gastroesophageal reflux disease)   . HTN (hypertension)   . Hyperlipidemia   . Seasonal allergies   . Shortness of breath dyspnea     Review of systems:  Otherwise negative.    Physical Exam  Gen: Alert, oriented. Appears stated age.  HEENT:  PERRLA. Lungs: No respiratory distress CV: RRR Abd: soft, benign, no masses Ext: No edema    Planned procedures: Proceed with colonoscopy. The patient understands the nature of the planned procedure, indications, risks, alternatives and potential complications including but not limited to bleeding, infection, perforation, damage to internal organs and possible oversedation/side effects from anesthesia. The patient agrees and gives consent to proceed.  Please refer to procedure notes for findings, recommendations and patient disposition/instructions.     Arthur Miyamoto MD, MPH Gastroenterology 09/23/2020  8:20 AM

## 2020-09-23 NOTE — Op Note (Signed)
[facility] Gastroenterology Patient Name: Arthur Owens Procedure Date: 09/23/2020 8:15 AM MRN: [id number] Account #: [id number] Date of Birth: 11-11-1940 Admit Type: Outpatient Age: 80 Room: [facility] ENDO ROOM 3 Gender: Male Note Status: Finalized Procedure:             Colonoscopy Indications:           High risk colon cancer surveillance: Personal history                         of colonic polyps Providers:             Andrey Farmer MD, MD Medicines:             Monitored Anesthesia Care Complications:         No immediate complications. Estimated blood loss:                         Minimal. Procedure:             Pre-Anesthesia Assessment:                        - Prior to the procedure, a History and Physical was                         performed, and patient medications and allergies were                         reviewed. The patient is competent. The risks and                         benefits of the procedure and the sedation options and                         risks were discussed with the patient. All questions                         were answered and informed consent was obtained.                         Patient identification and proposed procedure were                         verified by the physician, the nurse, the anesthetist                         and the technician in the endoscopy suite. Mental                         Status Examination: alert and oriented. Airway                         Examination: normal oropharyngeal airway and neck                         mobility. Respiratory Examination: clear to                         auscultation. CV Examination: normal. Prophylactic  Antibiotics: The patient does not require prophylactic                         antibiotics. Prior Anticoagulants: The patient has                         taken no previous anticoagulant or antiplatelet                         agents. ASA Grade  Assessment: II - A patient with mild                         systemic disease. After reviewing the risks and                         benefits, the patient was deemed in satisfactory                         condition to undergo the procedure. The anesthesia                         plan was to use monitored anesthesia care (MAC).                         Immediately prior to administration of medications,                         the patient was re-assessed for adequacy to receive                         sedatives. The heart rate, respiratory rate, oxygen                         saturations, blood pressure, adequacy of pulmonary                         ventilation, and response to care were monitored                         throughout the procedure. The physical status of the                         patient was re-assessed after the procedure.                        After obtaining informed consent, the colonoscope was                         passed under direct vision. Throughout the procedure,                         the patient's blood pressure, pulse, and oxygen                         saturations were monitored continuously. The                         Colonoscope was introduced through the anus and  advanced to the the cecum, identified by appendiceal                         orifice and ileocecal valve. The colonoscopy was                         performed without difficulty. The patient tolerated                         the procedure well. The quality of the bowel                         preparation was good. Findings:      The perianal and digital rectal examinations were normal.      A 2 mm polyp was found in the ascending colon. The polyp was sessile.       The polyp was removed with a jumbo cold forceps. Resection and retrieval       were complete. Estimated blood loss was minimal.      Scattered small-mouthed diverticula were found in the sigmoid colon,        descending colon, transverse colon, hepatic flexure and ascending colon.      The exam was otherwise without abnormality on direct and retroflexion       views. Impression:            - One 2 mm polyp in the ascending colon, removed with                         a jumbo cold forceps. Resected and retrieved.                        - Diverticulosis in the sigmoid colon, in the                         descending colon, in the transverse colon, at the                         hepatic flexure and in the ascending colon.                        - The examination was otherwise normal on direct and                         retroflexion views. Recommendation:        - Discharge patient to home.                        - Resume previous diet.                        - Continue present medications.                        - Await pathology results.                        - Repeat colonoscopy is not recommended due to current  age (75 years or older) for surveillance.                        - Return to referring physician as previously                         scheduled. Procedure Code(s):     --- Professional ---                        [phone], Colonoscopy, flexible; with biopsy, single or                         multiple Diagnosis Code(s):     --- Professional ---                        Z86.010, Personal history of colonic polyps                        K63.5, Polyp of colon                        K57.30, Diverticulosis of large intestine without                         perforation or abscess without bleeding CPT copyright 2019 [employer]. All rights reserved. The codes documented in this report are preliminary and upon coder review may  be revised to meet current compliance requirements. Andrey Farmer MD, MD 09/23/2020 8:50:19 AM Number of Addenda: 0 Note Initiated On: 09/23/2020 8:15 AM Scope Withdrawal Time: 0 hours 11 minutes 33 seconds  Total Procedure  Duration: 0 hours 16 minutes 45 seconds  Estimated Blood Loss:  Estimated blood loss was minimal.      [facility]

## 2020-09-26 ENCOUNTER — Encounter: Payer: Self-pay | Admitting: Gastroenterology

## 2020-09-28 LAB — SURGICAL PATHOLOGY

## 2020-10-05 DIAGNOSIS — M13161 Monoarthritis, not elsewhere classified, right knee: Secondary | ICD-10-CM | POA: Diagnosis not present

## 2020-10-13 DIAGNOSIS — R0600 Dyspnea, unspecified: Secondary | ICD-10-CM | POA: Diagnosis not present

## 2020-10-13 DIAGNOSIS — J449 Chronic obstructive pulmonary disease, unspecified: Secondary | ICD-10-CM | POA: Diagnosis not present

## 2020-10-24 DIAGNOSIS — R7303 Prediabetes: Secondary | ICD-10-CM | POA: Diagnosis not present

## 2020-10-24 DIAGNOSIS — Z125 Encounter for screening for malignant neoplasm of prostate: Secondary | ICD-10-CM | POA: Diagnosis not present

## 2020-10-24 DIAGNOSIS — Z1329 Encounter for screening for other suspected endocrine disorder: Secondary | ICD-10-CM | POA: Diagnosis not present

## 2020-10-24 DIAGNOSIS — N400 Enlarged prostate without lower urinary tract symptoms: Secondary | ICD-10-CM | POA: Diagnosis not present

## 2020-10-24 DIAGNOSIS — I10 Essential (primary) hypertension: Secondary | ICD-10-CM | POA: Diagnosis not present

## 2020-10-24 DIAGNOSIS — E7800 Pure hypercholesterolemia, unspecified: Secondary | ICD-10-CM | POA: Diagnosis not present

## 2020-11-14 DIAGNOSIS — R7303 Prediabetes: Secondary | ICD-10-CM | POA: Diagnosis not present

## 2020-11-14 DIAGNOSIS — J309 Allergic rhinitis, unspecified: Secondary | ICD-10-CM | POA: Diagnosis not present

## 2020-11-14 DIAGNOSIS — J432 Centrilobular emphysema: Secondary | ICD-10-CM | POA: Diagnosis not present

## 2020-11-14 DIAGNOSIS — Z79899 Other long term (current) drug therapy: Secondary | ICD-10-CM | POA: Diagnosis not present

## 2020-11-14 DIAGNOSIS — R809 Proteinuria, unspecified: Secondary | ICD-10-CM | POA: Diagnosis not present

## 2020-11-14 DIAGNOSIS — R918 Other nonspecific abnormal finding of lung field: Secondary | ICD-10-CM | POA: Diagnosis not present

## 2020-11-14 DIAGNOSIS — I10 Essential (primary) hypertension: Secondary | ICD-10-CM | POA: Diagnosis not present

## 2020-11-14 DIAGNOSIS — E559 Vitamin D deficiency, unspecified: Secondary | ICD-10-CM | POA: Diagnosis not present

## 2020-11-14 DIAGNOSIS — H25092 Other age-related incipient cataract, left eye: Secondary | ICD-10-CM | POA: Diagnosis not present

## 2020-11-14 DIAGNOSIS — Z0000 Encounter for general adult medical examination without abnormal findings: Secondary | ICD-10-CM | POA: Diagnosis not present

## 2020-11-14 DIAGNOSIS — K219 Gastro-esophageal reflux disease without esophagitis: Secondary | ICD-10-CM | POA: Diagnosis not present

## 2020-11-14 DIAGNOSIS — E7800 Pure hypercholesterolemia, unspecified: Secondary | ICD-10-CM | POA: Diagnosis not present

## 2020-11-14 DIAGNOSIS — N400 Enlarged prostate without lower urinary tract symptoms: Secondary | ICD-10-CM | POA: Diagnosis not present

## 2021-01-16 DIAGNOSIS — B351 Tinea unguium: Secondary | ICD-10-CM | POA: Diagnosis not present

## 2021-01-16 DIAGNOSIS — M79674 Pain in right toe(s): Secondary | ICD-10-CM | POA: Diagnosis not present

## 2021-01-16 DIAGNOSIS — M79675 Pain in left toe(s): Secondary | ICD-10-CM | POA: Diagnosis not present

## 2021-03-20 ENCOUNTER — Other Ambulatory Visit: Payer: Self-pay | Admitting: Specialist

## 2021-03-20 DIAGNOSIS — J432 Centrilobular emphysema: Secondary | ICD-10-CM | POA: Diagnosis not present

## 2021-03-20 DIAGNOSIS — R0609 Other forms of dyspnea: Secondary | ICD-10-CM

## 2021-03-29 DIAGNOSIS — R55 Syncope and collapse: Secondary | ICD-10-CM | POA: Diagnosis not present

## 2021-03-29 DIAGNOSIS — K219 Gastro-esophageal reflux disease without esophagitis: Secondary | ICD-10-CM | POA: Diagnosis not present

## 2021-03-29 DIAGNOSIS — J449 Chronic obstructive pulmonary disease, unspecified: Secondary | ICD-10-CM | POA: Diagnosis not present

## 2021-03-29 DIAGNOSIS — I10 Essential (primary) hypertension: Secondary | ICD-10-CM | POA: Diagnosis not present

## 2021-03-29 DIAGNOSIS — E669 Obesity, unspecified: Secondary | ICD-10-CM | POA: Diagnosis not present

## 2021-03-29 DIAGNOSIS — E7800 Pure hypercholesterolemia, unspecified: Secondary | ICD-10-CM | POA: Diagnosis not present

## 2021-03-29 DIAGNOSIS — R918 Other nonspecific abnormal finding of lung field: Secondary | ICD-10-CM | POA: Diagnosis not present

## 2021-04-12 ENCOUNTER — Telehealth: Payer: Self-pay | Admitting: Urology

## 2021-04-12 ENCOUNTER — Other Ambulatory Visit: Payer: Self-pay | Admitting: Urology

## 2021-04-12 DIAGNOSIS — N138 Other obstructive and reflux uropathy: Secondary | ICD-10-CM

## 2021-04-12 NOTE — Telephone Encounter (Signed)
Pt needs refill on finasteride (PROSCAR) 5 MG tablet. When he called the pharmacy they told him he needed to call us. Would like a call back when done.

## 2021-04-14 NOTE — Telephone Encounter (Signed)
Left voicemail to return call. 

## 2021-04-14 NOTE — Telephone Encounter (Signed)
Can someone speak with him today?

## 2021-04-16 NOTE — Progress Notes (Signed)
04/17/2021 9:28 AM   Jake Samples October 25, 1940 [id number]  Referring provider: Toni Arthurs, NP [address] [place],  [place] [address]  Chief Complaint  Patient presents with   Follow-up   Medication Refill   Urological history: 1. BPH with LU TS -PSA 0.79 in 09/2020 -I PSS 6/2 -PVR 0 mL -managed with tamsulosin 0.4 mg daily and finasteride 5 mg daily  HPI: Arthur Owens is a 80 y.o. male who presents today for his yearly visit.   He has started to experience urge incontinence and stress incontinence 6 to 8 months ago.  He is voiding 5 to 6 times daily and nocturia x 3.  He states he loses urine when he hears water running, outside blowing leaves and when people talk about having to void.  He loses just a few drops of urine, so he does not have to wear a pad or change clothes as of yet.  Patient denies any modifying or aggravating factors.  Patient denies any gross hematuria, dysuria or suprapubic/flank pain.  Patient denies any fevers, chills, nausea or vomiting.     IPSS     Row Name 04/17/21 0800         International Prostate Symptom Score   How often have you had the sensation of not emptying your bladder? Not at All     How often have you had to urinate less than every two hours? Less than 1 in 5 times     How often have you found you stopped and started again several times when you urinated? Not at All     How often have you found it difficult to postpone urination? Less than 1 in 5 times     How often have you had a weak urinary stream? Less than 1 in 5 times     How often have you had to strain to start urination? Not at All     How many times did you typically get up at night to urinate? 3 Times     Total IPSS Score 6       Quality of Life due to urinary symptoms   If you were to spend the rest of your life with your urinary condition just the way it is now how would you feel about that? Mostly Satisfied               Score:  1-7 Mild 8-19  Moderate 20-35 Severe   PMH: Past Medical History:  Diagnosis Date   ADD (attention deficit disorder)    Anxiety    Bladder neck obstruction    Cancer (HCC)    H/O ADENOMATOUS POLYP OF COLON   COPD (chronic obstructive pulmonary disease) (HCC)    GERD (gastroesophageal reflux disease)    HTN (hypertension)    Hyperlipidemia    Seasonal allergies    Shortness of breath dyspnea     Surgical History: Past Surgical History:  Procedure Laterality Date   adentamous colon polyp     APPENDECTOMY     COLONOSCOPY WITH PROPOFOL N/A 01/28/2015   Procedure: COLONOSCOPY WITH PROPOFOL;  Surgeon: Manya Silvas, MD;  Location: [facility];  Service: Endoscopy;  Laterality: N/A;   COLONOSCOPY WITH PROPOFOL N/A 09/23/2020   Procedure: COLONOSCOPY WITH PROPOFOL;  Surgeon: [name], MD;  Location: [facility];  Service: Endoscopy;  Laterality: N/A;   ESOPHAGOGASTRODUODENOSCOPY (EGD) WITH PROPOFOL N/A 01/28/2015   Procedure: ESOPHAGOGASTRODUODENOSCOPY (EGD) WITH PROPOFOL;  Surgeon: Manya Silvas, MD;  Location: [facility];  Service: Endoscopy;  Laterality: N/A;   HERNIA REPAIR Left    LEFT INGUINAL   left humerus debridement Left    TONSILLECTOMY     TRIGGER FINGER RELEASE Left 04/15/2015   Procedure: LEFT RING FINGER DUPUYTREN RELEASE;  Surgeon: Leanor Kail, MD;  Location: [facility];  Service: Orthopedics;  Laterality: Left;    Home Medications:  Allergies as of 04/17/2021       Reactions   Hydrocodone Bit-homatrop Mbr Nausea And Vomiting        Medication List        Accurate as of April 17, 2021  9:28 AM. If you have any questions, ask your nurse or doctor.          albuterol 108 (90 Base) MCG/ACT inhaler Commonly known as: VENTOLIN HFA Inhale 2 puffs into the lungs every 6 (six) hours as needed for wheezing or shortness of breath.   atorvastatin 40 MG tablet Commonly known as: LIPITOR Take 40 mg by mouth daily. PM   cetirizine  10 MG tablet Commonly known as: ZYRTEC Take 10 mg by mouth daily. AM   famotidine 20 MG tablet Commonly known as: PEPCID Take 20 mg by mouth daily.   finasteride 5 MG tablet Commonly known as: PROSCAR Take 1 tablet (5 mg total) by mouth daily. Appt needed for further refills   fluticasone 50 MCG/ACT nasal spray Commonly known as: FLONASE Place 2 sprays into both nostrils as needed.   Gemtesa 75 MG Tabs Generic drug: Vibegron Take 75 mg by mouth daily. Started by: Zara Council, PA-C   hydrochlorothiazide 12.5 MG tablet Commonly known as: HYDRODIURIL Take 12.5 mg by mouth daily. AM   tamsulosin 0.4 MG Caps capsule Commonly known as: FLOMAX Take 1 capsule (0.4 mg total) by mouth daily. EVE   tiotropium 18 MCG inhalation capsule Commonly known as: SPIRIVA Place 18 mcg into inhaler and inhale daily. Pt uses as needed   vitamin B-12 1000 MCG tablet Commonly known as: CYANOCOBALAMIN Take 1,000 mcg by mouth daily.   Vitamin D3 50 MCG (2000 UT) capsule Take 2,000 Units by mouth daily.        Allergies:  Allergies  Allergen Reactions   Hydrocodone Bit-Homatrop Mbr Nausea And Vomiting    Family History: Family History  Problem Relation Age of Onset   Colon cancer Father    Prostate cancer Father    Stroke Mother    Diabetes Other    Hyperlipidemia Other    Glaucoma Other    Kidney disease Neg Hx    Kidney cancer Neg Hx    Bladder Cancer Neg Hx     Social History:  reports that he has quit smoking. His smoking use included cigarettes. He has a 30.00 pack-year smoking history. He has never used smokeless tobacco. He reports current alcohol use of about 2.0 standard drinks per week. He reports that he does not use drugs.  ROS: For pertinent review of systems please refer to history of present illness  Physical Exam: BP (!) 146/84    Pulse 69    Ht $R'5\' 8"'Ds$  (1.727 m)    Wt 189 lb (85.7 kg)    BMI 28.74 kg/m   Constitutional:  Well nourished. Alert and  oriented, No acute distress. HEENT: [place] AT, mask in place.  Trachea midline Cardiovascular: No clubbing, cyanosis, or edema. Respiratory: Normal respiratory effort, no increased work of breathing. Neurologic: Grossly intact, no focal deficits, moving all 4 extremities. Psychiatric: Normal  mood and affect.   Laboratory Data: WBC (White Blood Cell Count) 4.1 - 10.2 103/uL 4.9   RBC (Red Blood Cell Count) 4.69 - 6.13 106/uL 4.70   Hemoglobin 14.1 - 18.1 gm/dL 14.8   Hematocrit 40.0 - 52.0 % 45.2   MCV (Mean Corpuscular Volume) 80.0 - 100.0 fl 96.2   MCH (Mean Corpuscular Hemoglobin) 27.0 - 31.2 pg 31.5 High    MCHC (Mean Corpuscular Hemoglobin Concentration) 32.0 - 36.0 gm/dL 32.7   Platelet Count 150 - 450 103/uL 198   RDW-CV (Red Cell Distribution Width) 11.6 - 14.8 % 13.4   MPV (Mean Platelet Volume) 9.4 - 12.4 fl 9.0 Low    Neutrophils 1.50 - 7.80 103/uL 3.00   Lymphocytes 1.00 - 3.60 103/uL 1.40   Mixed Count 0.10 - 0.90 103/uL 0.50   Neutrophil % 32.0 - 70.0 % 63.1   Lymphocyte % 10.0 - 50.0 % 27.7   Mixed % 3.0 - 14.4 % 9.2   Resulting Agency  [facility]  Specimen Collected: 10/24/20 08:19 Last Resulted: 10/24/20 09:15  Received From: [facility]  Result Received: 03/20/21 14:56   Glucose 70 - 110 mg/dL 104   Sodium 136 - 145 mmol/L 142   Potassium 3.6 - 5.1 mmol/L 4.1   Chloride 97 - 109 mmol/L 107   Carbon Dioxide (CO2) 22.0 - 32.0 mmol/L 26.9   Urea Nitrogen (BUN) 7 - 25 mg/dL 26 High    Creatinine 0.7 - 1.3 mg/dL 1.2   Glomerular Filtration Rate (eGFR), MDRD Estimate >60 mL/min/1.73sq m 58 Low    Calcium 8.7 - 10.3 mg/dL 9.2   AST  8 - 39 U/L 23   ALT  6 - 57 U/L 39   Alk Phos (alkaline Phosphatase) 34 - 104 U/L 39   Albumin 3.5 - 4.8 g/dL 4.1   Bilirubin, Total 0.3 - 1.2 mg/dL 0.6   Protein, Total 6.1 - 7.9 g/dL 6.3   A/G Ratio 1.0 - 5.0 gm/dL 1.9   Resulting Agency  [facility]  Specimen Collected: 10/24/20  08:19 Last Resulted: 10/24/20 12:46  Received From: [facility]  Result Received: 03/20/21 14:56   Hemoglobin A1C 4.2 - 5.6 % 6.0 High    Average Blood Glucose (Calc) mg/dL 126   Resulting Agency  [facility] - [facility]  Narrative Performed by [facility] Normal Range:    4.2 - 5.6%  Increased Risk:  5.7 - 6.4%  Diabetes:        >= 6.5%  Glycemic Control for adults with diabetes:  <7%   Specimen Collected: 10/24/20 08:19 Last Resulted: 10/24/20 12:15  Received From: [facility]  Result Received: 03/20/21 14:56   Cholesterol, Total 100 - 200 mg/dL 171   Triglyceride 35 - 199 mg/dL 87   HDL (High Density Lipoprotein) Cholesterol 29.0 - 71.0 mg/dL 46.8   LDL Calculated 0 - 130 mg/dL 107   VLDL Cholesterol mg/dL 17   Cholesterol/HDL Ratio  3.7   Resulting Agency  [facility]  Specimen Collected: 10/24/20 08:19 Last Resulted: 10/24/20 12:47  Received From: [facility]  Result Received: 03/20/21 14:56   Thyroid Stimulating Hormone (TSH) 0.450-5.330 uIU/ml uIU/mL 2.358   Resulting Agency  [facility] - [facility]  Specimen Collected: 10/24/20 08:19 Last Resulted: 10/24/20 12:48  Received From: [facility]  Result Received: 03/20/21 14:56   PSA (Prostate Specific  Antigen), Total 0.10 - 4.00 ng/mL 0.79   Resulting Agency  [facility] - [facility]  Narrative Performed by [facility] - [facility] Test results were determined with Beckman Coulter Hybritech Assay. Values obtained with different assay methods cannot be used interchangeably in serial testing. Assay results should not be interpreted as absolute evidence of the presence or absence of malignant disease Specimen Collected: 10/24/20 08:19 Last Resulted: 10/24/20 12:48  Received From: [facility]  Result Received: 03/20/21 14:56  I have reviewed the labs.    Pertinent Imaging:  Most Recent  Scan  Result 68ml 04/17/21 09:26     Assessment & Plan:    1. BPH with LUTS -PSA stable -most bothersome symptoms are incontinence -continue conservative management, avoiding bladder irritants and timed voiding's -Continue tamsulosin 0.4 mg daily and finasteride 5 mg daily-refills given  2. Incontinence -Relatively new onset of urge incontinence -We will give a trial Gemtesa 75 mg, #28 samples are given-his insurance will not cover Gemtesa but will cover Myrbetriq and if the Logan Bores is effective we will prescribe the Myrbetriq -We will have him follow-up in 3 weeks and I reminded him that is important to keep this appointment because if the medication does not control his urge incontinence, we need to consider cystoscopy to rule out any bladder cancer or worsening of BPH causing the leakage   Return in about 3 weeks (around 05/08/2021) for IPSS and PVR.  Zara Council, PA-C  [facility] [address], [place], [place] [address] [phone]

## 2021-04-17 ENCOUNTER — Other Ambulatory Visit: Payer: Self-pay

## 2021-04-17 ENCOUNTER — Ambulatory Visit: Payer: PPO | Admitting: Urology

## 2021-04-17 ENCOUNTER — Encounter: Payer: Self-pay | Admitting: Urology

## 2021-04-17 VITALS — BP 146/84 | HR 69 | Ht 68.0 in | Wt 189.0 lb

## 2021-04-17 DIAGNOSIS — R32 Unspecified urinary incontinence: Secondary | ICD-10-CM | POA: Diagnosis not present

## 2021-04-17 DIAGNOSIS — N401 Enlarged prostate with lower urinary tract symptoms: Secondary | ICD-10-CM | POA: Diagnosis not present

## 2021-04-17 DIAGNOSIS — N138 Other obstructive and reflux uropathy: Secondary | ICD-10-CM

## 2021-04-17 LAB — BLADDER SCAN AMB NON-IMAGING

## 2021-04-17 MED ORDER — GEMTESA 75 MG PO TABS: 75.0000 mg | ORAL_TABLET | Freq: Every day | ORAL | 0 refills | Status: DC

## 2021-04-17 MED ORDER — TAMSULOSIN HCL 0.4 MG PO CAPS: 0.4000 mg | ORAL_CAPSULE | Freq: Every day | ORAL | 3 refills | Status: DC

## 2021-04-17 MED ORDER — FINASTERIDE 5 MG PO TABS: 5.0000 mg | ORAL_TABLET | Freq: Every day | ORAL | 3 refills | Status: DC

## 2021-04-19 ENCOUNTER — Ambulatory Visit
Admission: RE | Admit: 2021-04-19 | Discharge: 2021-04-19 | Disposition: A | Discharge status: Home / Self Care | Payer: PPO | Source: Ambulatory Visit | Attending: Specialist | Admitting: Specialist

## 2021-04-19 ENCOUNTER — Other Ambulatory Visit: Payer: Self-pay

## 2021-04-19 DIAGNOSIS — R911 Solitary pulmonary nodule: Secondary | ICD-10-CM | POA: Diagnosis not present

## 2021-04-19 DIAGNOSIS — R0600 Dyspnea, unspecified: Secondary | ICD-10-CM | POA: Diagnosis not present

## 2021-04-19 DIAGNOSIS — I700 Atherosclerosis of aorta: Secondary | ICD-10-CM | POA: Diagnosis not present

## 2021-04-19 DIAGNOSIS — J432 Centrilobular emphysema: Secondary | ICD-10-CM | POA: Insufficient documentation

## 2021-04-19 DIAGNOSIS — R0609 Other forms of dyspnea: Secondary | ICD-10-CM | POA: Insufficient documentation

## 2021-04-19 DIAGNOSIS — J439 Emphysema, unspecified: Secondary | ICD-10-CM | POA: Diagnosis not present

## 2021-05-07 NOTE — Progress Notes (Signed)
05/08/2021 2:47 PM   Arthur Owens Mar 24, 1941 [id number]  Referring provider: Latanya Maudlin, NP [address] [place],  [place] [address]  Chief Complaint  Patient presents with   Follow-up    3 week follow-up   Urological history: 1. BPH with LU TS -PSA 0.79 in 09/2020 -I PSS 5/1 -PVR 0 mL 03/2021 -managed with tamsulosin 0.4 mg daily and finasteride 5 mg daily  HPI: Arthur Owens is a 81 y.o. male who presents today for 3 week follow up after a trial of Gemtesa 75 mg daily for his urge incontinence.  He states the urge incontinence abated with the [facility], but it is going to be cost prohibitive for him.   Patient denies any modifying or aggravating factors.  Patient denies any gross hematuria, dysuria or suprapubic/flank pain.  Patient denies any fevers, chills, nausea or vomiting.     IPSS     Row Name 05/08/21 1400         International Prostate Symptom Score   How often have you had the sensation of not emptying your bladder? Not at All     How often have you had to urinate less than every two hours? Less than 1 in 5 times     How often have you found you stopped and started again several times when you urinated? Not at All     How often have you found it difficult to postpone urination? Not at All     How often have you had a weak urinary stream? Less than 1 in 5 times     How often have you had to strain to start urination? Not at All     How many times did you typically get up at night to urinate? 3 Times     Total IPSS Score 5       Quality of Life due to urinary symptoms   If you were to spend the rest of your life with your urinary condition just the way it is now how would you feel about that? Pleased                Score:  1-7 Mild 8-19 Moderate 20-35 Severe   PMH: Past Medical History:  Diagnosis Date   ADD (attention deficit disorder)    Anxiety    Bladder neck obstruction    Cancer (HCC)    H/O ADENOMATOUS POLYP OF COLON    COPD (chronic obstructive pulmonary disease) (HCC)    GERD (gastroesophageal reflux disease)    HTN (hypertension)    Hyperlipidemia    Seasonal allergies    Shortness of breath dyspnea     Surgical History: Past Surgical History:  Procedure Laterality Date   adentamous colon polyp     APPENDECTOMY     COLONOSCOPY WITH PROPOFOL N/A 01/28/2015   Procedure: COLONOSCOPY WITH PROPOFOL;  Surgeon: Manya Silvas, MD;  Location: [facility];  Service: Endoscopy;  Laterality: N/A;   COLONOSCOPY WITH PROPOFOL N/A 09/23/2020   Procedure: COLONOSCOPY WITH PROPOFOL;  Surgeon: [name], MD;  Location: [facility] ENDOS[facility];  Service: Endoscopy;  Laterality: N/A;   ESOPHAGOGASTRODUODENOSCOPY (EGD) WITH PROPOFOL N/A 01/28/2015   Procedure: ESOPHAGOGASTRODUODENOSCOPY (EGD) WITH PROPOFOL;  Surgeon: Manya Silvas, MD;  [facility] END[facility]COPY;  Service: Endoscopy;  Laterality: N/A;   HERNIA REPAIR Left    LEFT INGUINAL   left humerus debridement Left    TONSILLECTOMY     TRIGGER FINGER RELEASE Left 04/15/2015   Procedure:  LEFT RING FINGER DUPUYTREN RELEASE;  Surgeon: Leanor Kail, MD;  Location: [facility];  Service: Orthopedics;  Laterality: Left;    Home Medications:  Allergies as of 05/08/2021       Reactions   Hydrocodone Bit-homatrop Mbr Nausea And Vomiting        Medication List        Accurate as of May 08, 2021  2:47 PM. If you have any questions, ask your nurse or doctor.          STOP taking these medications    Gemtesa 75 MG Tabs Generic drug: Vibegron Stopped by: Zara Council, PA-C       TAKE these medications    albuterol 108 (90 Base) MCG/ACT inhaler Commonly known as: VENTOLIN HFA Inhale 2 puffs into the lungs every 6 (six) hours as needed for wheezing or shortness of breath.   atorvastatin 40 MG tablet Commonly known as: LIPITOR Take 40 mg by mouth daily. PM   cetirizine 10 MG tablet Commonly known as: ZYRTEC Take 10 mg by  mouth daily. AM   famotidine 20 MG tablet Commonly known as: PEPCID Take 20 mg by mouth daily.   finasteride 5 MG tablet Commonly known as: PROSCAR Take 1 tablet (5 mg total) by mouth daily. Appt needed for further refills   fluticasone 50 MCG/ACT nasal spray Commonly known as: FLONASE Place 2 sprays into both nostrils as needed.   hydrochlorothiazide 12.5 MG tablet Commonly known as: HYDRODIURIL Take 12.5 mg by mouth daily. AM   mirabegron ER 50 MG Tb24 tablet Commonly known as: Myrbetriq Take 1 tablet (50 mg total) by mouth daily. Started by: Zara Council, PA-C   tamsulosin 0.4 MG Caps capsule Commonly known as: FLOMAX Take 1 capsule (0.4 mg total) by mouth daily. EVE   tiotropium 18 MCG inhalation capsule Commonly known as: SPIRIVA Place 18 mcg into inhaler and inhale daily. Pt uses as needed   vitamin B-12 1000 MCG tablet Commonly known as: CYANOCOBALAMIN Take 1,000 mcg by mouth daily.   Vitamin D3 50 MCG (2000 UT) capsule Take 2,000 Units by mouth daily.        Allergies:  Allergies  Allergen Reactions   Hydrocodone Bit-Homatrop Mbr Nausea And Vomiting    Family History: Family History  Problem Relation Age of Onset   Colon cancer Father    Prostate cancer Father    Stroke Mother    Diabetes Other    Hyperlipidemia Other    Glaucoma Other    Kidney disease Neg Hx    Kidney cancer Neg Hx    Bladder Cancer Neg Hx     Social History:  reports that he has quit smoking. His smoking use included cigarettes. He has a 30.00 pack-year smoking history. He has never used smokeless tobacco. He reports current alcohol use of about 2.0 standard drinks per week. He reports that he does not use drugs.  ROS: For pertinent review of systems please refer to history of present illness  Physical Exam: BP (!) 174/76    Pulse 76    Ht 5\' 8"  (1.727 m)    Wt 190 lb (86.2 kg)    BMI 28.89 kg/m   Constitutional:  Well nourished. Alert and oriented, No acute  distress. HEENT: [place] AT, mask in place.  Trachea midline Cardiovascular: No clubbing, cyanosis, or edema. Respiratory: Normal respiratory effort, no increased work of breathing. Neurologic: Grossly intact, no focal deficits, moving all 4 extremities. Psychiatric: Normal mood and affect.  Laboratory Data: N/A  Pertinent Imaging:  04/17/21 09:26  Scan Result 79ml      Assessment & Plan:    1. Incontinence -Abated with Logan Bores -Patient brought in his insurance formulary and Logan Bores is not listed, but Myrbetriq 50 mg is covered by his insurance so I sent a prescription in for that -So he will start the Myrbetriq 50 mg daily and contact us with any issues  2. BPH with LU TS -continue tamsulosin 0.4 mg daily and finasteride 5 mg daily    Return in about 1 year (around 05/08/2022) for IPSS and PVR.  Zara Council, PA-C  [facility] [address], [place], [place] [address] [phone]

## 2021-05-08 ENCOUNTER — Encounter: Payer: Self-pay | Admitting: Urology

## 2021-05-08 ENCOUNTER — Other Ambulatory Visit: Payer: Self-pay

## 2021-05-08 ENCOUNTER — Ambulatory Visit: Payer: PPO | Admitting: Urology

## 2021-05-08 VITALS — BP 174/76 | HR 76 | Ht 68.0 in | Wt 190.0 lb

## 2021-05-08 DIAGNOSIS — N401 Enlarged prostate with lower urinary tract symptoms: Secondary | ICD-10-CM

## 2021-05-08 DIAGNOSIS — N138 Other obstructive and reflux uropathy: Secondary | ICD-10-CM

## 2021-05-08 DIAGNOSIS — R32 Unspecified urinary incontinence: Secondary | ICD-10-CM | POA: Diagnosis not present

## 2021-05-08 MED ORDER — MIRABEGRON ER 50 MG PO TB24: 50.0000 mg | ORAL_TABLET | Freq: Every day | ORAL | 3 refills | Status: DC

## 2021-05-09 ENCOUNTER — Encounter: Payer: Self-pay | Admitting: Emergency Medicine

## 2021-05-09 ENCOUNTER — Ambulatory Visit
Admission: EM | Admit: 2021-05-09 | Discharge: 2021-05-09 | Disposition: A | Discharge status: Home / Self Care | Payer: PPO | Attending: Internal Medicine | Admitting: Internal Medicine

## 2021-05-09 DIAGNOSIS — H6123 Impacted cerumen, bilateral: Secondary | ICD-10-CM | POA: Diagnosis not present

## 2021-05-09 DIAGNOSIS — H35712 Central serous chorioretinopathy, left eye: Secondary | ICD-10-CM | POA: Diagnosis not present

## 2021-05-09 NOTE — Discharge Instructions (Signed)
Irrigation of both ears in the clinic today was effective in removing most of the wax from the ears.  Try using sweet's oil in the ear canals at bedtime once a week (secure with cotton ball).  Can try flushing the ear canals with warm water after in the shower, with a bulb syringe, to try to prevent recurrent buildup of the wax.  If symptoms recur, or if you are having trouble hearing, keep the followup appointment with ENT.

## 2021-05-09 NOTE — ED Triage Notes (Signed)
Pt reports left ear pain and feeling like has build up. Reports been putting debrox drops in ear. Can't get into ENT til Feb.

## 2021-05-09 NOTE — ED Provider Notes (Signed)
[facility]    CSN: [id number] Arrival date & time: 05/09/21  0955      History   Chief Complaint Chief Complaint  Patient presents with   Otalgia    HPI Arthur Owens is a 81 y.o. male. Has been using debrox to remove ear wax; left ear canal is a little sore when he touches it.  Wonders if he needs an antibiotic.  ENT appointment not available for several weeks.  No fever, no malaise.  No runny/congested nose, no cough.    HPI  Past Medical History:  Diagnosis Date   ADD (attention deficit disorder)    Anxiety    Bladder neck obstruction    Cancer ([place])    H/O ADENOMATOUS POLYP OF COLON   COPD (chronic obstructive pulmonary disease) (HCC)    GERD (gastroesophageal reflux disease)    HTN (hypertension)    Hyperlipidemia    Seasonal allergies    Shortness of breath dyspnea     Patient Active Problem List   Diagnosis Date Noted   Laceration of left cheek 08/20/2020   Syncope and collapse 08/19/2020   BPH (benign prostatic hyperplasia) 10/20/2015   Contracture of palmar fascia (Dupuytren's) 04/26/2015   Chronic hoarseness 12/14/2014   Brash 12/14/2014   H/O adenomatous polyp of colon 12/14/2014   Palmar fascial fibromatosis 06/08/2014   Essential (primary) hypertension 02/11/2014   Chronic obstructive pulmonary disease ([place]) 01/13/2014   Lung mass 01/13/2014   Other specified behavioral and emotional disorders with onset usually occurring in childhood and adolescence 10/01/2013   Bladder neck obstruction 10/01/2013   Acid reflux 10/01/2013   Hypercholesterolemia 10/01/2013   Allergic rhinitis, seasonal 10/01/2013    Past Surgical History:  Procedure Laterality Date   adentamous colon polyp     APPENDECTOMY     COLONOSCOPY WITH PROPOFOL N/A 01/28/2015   Procedure: COLONOSCOPY WITH PROPOFOL;  Surgeon: Manya Silvas, MD;  Location: [place];  Service: Endoscopy;  Laterality: N/A;   COLONOSCOPY WITH PROPOFOL N/A 09/23/2020   Procedure:  COLONOSCOPY WITH PROPOFOL;  Surgeon: [name], MD;  Location: [facility] ENDOSCOPY;  Service: Endoscopy;  Laterality: N/A;   ESOPHAGOGASTRODUODENOSCOPY (EGD) WITH PROPOFOL N/A 01/28/2015   Procedure: ESOPHAGOGASTRODUODENOSCOPY (EGD) WITH PROPOFOL;  Surgeon: Manya Silvas, MD;  Location: [facility] ENDOSCOPY;  Service: Endoscopy;  Laterality: N/A;   HERNIA REPAIR Left    LEFT INGUINAL   left humerus debridement Left    TONSILLECTOMY     TRIGGER FINGER RELEASE Left 04/15/2015   Procedure: LEFT RING FINGER DUPUYTREN RELEASE;  Surgeon: Leanor Kail, MD;  Location: [place];  Service: Orthopedics;  Laterality: Left;       Home Medications    Prior to Admission medications   Medication Sig Start Date End Date Taking? Authorizing Provider  albuterol (PROVENTIL HFA;VENTOLIN HFA) 108 (90 BASE) MCG/ACT inhaler Inhale 2 puffs into the lungs every 6 (six) hours as needed for wheezing or shortness of breath.    [provider]  atorvastatin (LIPITOR) 40 MG tablet Take 40 mg by mouth daily. PM    [provider]  cetirizine (ZYRTEC) 10 MG tablet Take 10 mg by mouth daily. AM    [provider]  Cholecalciferol (VITAMIN D3) 50 MCG (2000 UT) capsule Take 2,000 Units by mouth daily. 10/15/18   [provider]  famotidine (PEPCID) 20 MG tablet Take 20 mg by mouth daily. 04/14/18   [provider]  finasteride (PROSCAR) 5 MG tablet Take 1 tablet (5 mg  total) by mouth daily. Appt needed for further refills 04/17/21   Zara Council A, PA-C  fluticasone (FLONASE) 50 MCG/ACT nasal spray Place 2 sprays into both nostrils as needed.     [provider]  hydrochlorothiazide (HYDRODIURIL) 12.5 MG tablet Take 12.5 mg by mouth daily. AM    [provider]  mirabegron ER (MYRBETRIQ) 50 MG TB24 tablet Take 1 tablet (50 mg total) by mouth daily. 05/08/21   Zara Council A, PA-C  tamsulosin (FLOMAX) 0.4 MG CAPS capsule Take 1 capsule (0.4 mg  total) by mouth daily. EVE 04/17/21   McGowan, Larene Beach A, PA-C  tiotropium (SPIRIVA) 18 MCG inhalation capsule Place 18 mcg into inhaler and inhale daily. Pt uses as needed    [provider]  vitamin B-12 (CYANOCOBALAMIN) 1000 MCG tablet Take 1,000 mcg by mouth daily.    [provider]    Family History Family History  Problem Relation Age of Onset   Colon cancer Father    Prostate cancer Father    Stroke Mother    Diabetes Other    Hyperlipidemia Other    Glaucoma Other    Kidney disease Neg Hx    Kidney cancer Neg Hx    Bladder Cancer Neg Hx     Social History Social History   Tobacco Use   Smoking status: Former    Packs/day: 2.00    Years: 15.00    Pack years: 30.00    Types: Cigarettes   Smokeless tobacco: Never   Tobacco comments:    quit 2 years  Vaping Use   Vaping Use: Never used  Substance Use Topics   Alcohol use: Yes    Alcohol/week: 2.0 standard drinks    Types: 2 Glasses of wine per week   Drug use: No     Allergies   Hydrocodone bit-homatrop mbr   Review of Systems Review of Systems see HPI   Physical Exam Triage Vital Signs ED Triage Vitals  Enc Vitals Group     BP 05/09/21 1016 (!) 161/81     Pulse Rate 05/09/21 1016 62     Resp 05/09/21 1016 18     Temp 05/09/21 1016 98.2 F (36.8 C)     Temp Source 05/09/21 1016 Oral     SpO2 05/09/21 1016 95 %     Weight --      Height --      Pain Score 05/09/21 1015 2     Pain Loc --    Updated Vital Signs BP (!) 161/81 (BP Location: Left Arm)    Pulse 62    Temp 98.2 F (36.8 C) (Oral)    Resp 18    SpO2 95%   Physical Exam Constitutional:      General: He is not in acute distress.    Appearance: He is not ill-appearing or toxic-appearing.  HENT:     Head: Atraumatic.     Comments: Bilateral ear canals are occluded by waxy material; this is pale/whitish on the left, and yellow/hard appearing on the right. Eyes:     Conjunctiva/sclera:     Right eye: Right  conjunctiva is not injected. No exudate.    Left eye: Left conjunctiva is not injected. No exudate.    Comments: Conjugate gaze observed  Cardiovascular:     Rate and Rhythm: Normal rate.  Pulmonary:     Effort: Pulmonary effort is normal. No respiratory distress.  Abdominal:     General: There is no distension.  Musculoskeletal:     Cervical back: Neck supple.     Comments: Walked into the urgent care independently  Skin:    General: Skin is warm and dry.     Comments: Pink, no cyanosis  Neurological:     Mental Status: He is alert.     Comments: Face symmetric, speech clear, coherent, logical     UC Treatments / Results  Labs (all labs ordered are listed, but only abnormal results are displayed) Labs Reviewed - No data to display N/A  EKG N/A  Radiology No results found. N/A  Procedures Procedures (including critical care time) Following ear irrigation by the clinical staff, a clear view of both eardrums and ear canals is obtained.  There is no redness or lesion of either ear canal, no pain on manipulation of the outer ear.  Left TMs is unremarkable; R is dull but no erythema.  Medications Ordered in UC Medications - No data to display Topical Colace was used to soften the earwax prior to ear irrigation  Initial Impression / Assessment and Plan / UC Course  Differential dx of the dull R TM includes serous otitis and scarring.  Followup with ENT if hearing is not at baseline or for recheck of wax impactions.  Discussed some measures to consider taking at home.     Final Clinical Impressions(s) / UC Diagnoses   Final diagnoses:  Bilateral impacted cerumen     Discharge Instructions      Irrigation of both ears in the clinic today was effective in removing most of the wax from the ears.  Try using sweet's oil in the ear canals at bedtime once a week (secure with cotton ball).  Can try flushing the ear canals with warm water after in the shower, with a bulb  syringe, to try to prevent recurrent buildup of the wax.  If symptoms recur, or if you are having trouble hearing, keep the followup appointment with ENT.   ED Prescriptions   None    PDMP not reviewed this encounter.   Wynona Luna, MD 05/10/21 [phone]

## 2021-05-23 DIAGNOSIS — H353221 Exudative age-related macular degeneration, left eye, with active choroidal neovascularization: Secondary | ICD-10-CM | POA: Diagnosis not present

## 2021-11-17 DIAGNOSIS — H353221 Exudative age-related macular degeneration, left eye, with active choroidal neovascularization: Secondary | ICD-10-CM | POA: Diagnosis not present

## 2021-12-12 DIAGNOSIS — H353221 Exudative age-related macular degeneration, left eye, with active choroidal neovascularization: Secondary | ICD-10-CM | POA: Diagnosis not present

## 2021-12-26 DIAGNOSIS — H2512 Age-related nuclear cataract, left eye: Secondary | ICD-10-CM | POA: Diagnosis not present

## 2022-01-02 ENCOUNTER — Encounter: Payer: Self-pay | Admitting: Ophthalmology

## 2022-01-04 NOTE — Discharge Instructions (Signed)

## 2022-01-08 ENCOUNTER — Encounter
Admission: RE | Disposition: A | Discharge status: Home / Self Care | Payer: Self-pay | Source: Home / Self Care | Attending: Ophthalmology

## 2022-01-08 ENCOUNTER — Other Ambulatory Visit: Payer: Self-pay

## 2022-01-08 ENCOUNTER — Ambulatory Visit: Payer: PPO | Admitting: Anesthesiology

## 2022-01-08 ENCOUNTER — Encounter: Payer: Self-pay | Admitting: Ophthalmology

## 2022-01-08 ENCOUNTER — Ambulatory Visit
Admission: RE | Admit: 2022-01-08 | Discharge: 2022-01-08 | Disposition: A | Discharge status: Home / Self Care | Payer: PPO | Attending: Ophthalmology | Admitting: Ophthalmology

## 2022-01-08 DIAGNOSIS — H2512 Age-related nuclear cataract, left eye: Secondary | ICD-10-CM | POA: Insufficient documentation

## 2022-01-08 DIAGNOSIS — Z87891 Personal history of nicotine dependence: Secondary | ICD-10-CM | POA: Insufficient documentation

## 2022-01-08 DIAGNOSIS — K219 Gastro-esophageal reflux disease without esophagitis: Secondary | ICD-10-CM | POA: Diagnosis not present

## 2022-01-08 DIAGNOSIS — J449 Chronic obstructive pulmonary disease, unspecified: Secondary | ICD-10-CM | POA: Insufficient documentation

## 2022-01-08 DIAGNOSIS — Z79899 Other long term (current) drug therapy: Secondary | ICD-10-CM | POA: Insufficient documentation

## 2022-01-08 DIAGNOSIS — I10 Essential (primary) hypertension: Secondary | ICD-10-CM | POA: Insufficient documentation

## 2022-01-08 HISTORY — PX: CATARACT EXTRACTION W/PHACO: SHX586

## 2022-01-08 SURGERY — PHACOEMULSIFICATION, CATARACT, WITH IOL INSERTION
Anesthesia: Monitor Anesthesia Care | Site: Eye | Laterality: Left

## 2022-01-08 MED ORDER — TETRACAINE HCL 0.5 % OP SOLN
1.0000 [drp] | OPHTHALMIC | Status: AC | PRN
  Administered 2022-01-08 (×3): 1 [drp] via OPHTHALMIC

## 2022-01-08 MED ORDER — LACTATED RINGERS IV SOLN: INTRAVENOUS | Status: DC

## 2022-01-08 MED ORDER — SIGHTPATH DOSE#1 BSS IO SOLN
INTRAOCULAR | Status: DC | PRN
  Administered 2022-01-08: 15 mL via INTRAOCULAR

## 2022-01-08 MED ORDER — FENTANYL CITRATE PF 50 MCG/ML IJ SOSY: 25.0000 ug | PREFILLED_SYRINGE | INTRAMUSCULAR | Status: DC | PRN

## 2022-01-08 MED ORDER — LIDOCAINE HCL (PF) 2 % IJ SOLN
INTRAOCULAR | Status: DC | PRN
  Administered 2022-01-08: 4 mL via INTRAOCULAR

## 2022-01-08 MED ORDER — FENTANYL CITRATE (PF) 100 MCG/2ML IJ SOLN
INTRAMUSCULAR | Status: DC | PRN
  Administered 2022-01-08: 50 ug via INTRAVENOUS

## 2022-01-08 MED ORDER — SIGHTPATH DOSE#1 NA HYALUR & NA CHOND-NA HYALUR IO KIT
PACK | INTRAOCULAR | Status: DC | PRN
  Administered 2022-01-08: 1 via OPHTHALMIC

## 2022-01-08 MED ORDER — MIDAZOLAM HCL 2 MG/2ML IJ SOLN
INTRAMUSCULAR | Status: DC | PRN
  Administered 2022-01-08: 1 mg via INTRAVENOUS

## 2022-01-08 MED ORDER — ONDANSETRON HCL 4 MG/2ML IJ SOLN: 4.0000 mg | Freq: Once | INTRAMUSCULAR | Status: DC | PRN

## 2022-01-08 MED ORDER — SIGHTPATH DOSE#1 BSS IO SOLN
INTRAOCULAR | Status: DC | PRN
  Administered 2022-01-08: 103 mL via OPHTHALMIC

## 2022-01-08 MED ORDER — MOXIFLOXACIN HCL 0.5 % OP SOLN
OPHTHALMIC | Status: DC | PRN
  Administered 2022-01-08: 0.2 mL via OPHTHALMIC

## 2022-01-08 MED ORDER — ARMC OPHTHALMIC DILATING DROPS
1.0000 | OPHTHALMIC | Status: AC | PRN
  Administered 2022-01-08 (×3): 1 via OPHTHALMIC

## 2022-01-08 SURGICAL SUPPLY — 14 items
CATARACT SUITE SIGHTPATH (MISCELLANEOUS) ×1 IMPLANT
DISSECTOR HYDRO NUCLEUS 50X22 (MISCELLANEOUS) ×1 IMPLANT
FEE CATARACT SUITE SIGHTPATH (MISCELLANEOUS) ×1 IMPLANT
GLOVE SURG GAMMEX PI TX LF 7.5 (GLOVE) ×1 IMPLANT
GLOVE SURG SYN 8.5  E (GLOVE) ×1
GLOVE SURG SYN 8.5 E (GLOVE) ×1 IMPLANT
GLOVE SURG SYN 8.5 PF PI (GLOVE) ×1 IMPLANT
LENS IOL TECNIS EYHANCE 23.0 (Intraocular Lens) IMPLANT
NDL FILTER BLUNT 18X1 1/2 (NEEDLE) ×1 IMPLANT
NEEDLE FILTER BLUNT 18X 1/2SAF (NEEDLE) ×1
NEEDLE FILTER BLUNT 18X1 1/2 (NEEDLE) ×1 IMPLANT
SYR 3ML LL SCALE MARK (SYRINGE) ×1 IMPLANT
SYR 5ML LL (SYRINGE) ×1 IMPLANT
WATER STERILE IRR 250ML POUR (IV SOLUTION) ×1 IMPLANT

## 2022-01-08 NOTE — Anesthesia Postprocedure Evaluation (Signed)
Anesthesia Post Note  Patient: Arthur Owens  Procedure(s) Performed: CATARACT EXTRACTION PHACO AND INTRAOCULAR LENS PLACEMENT (IOC) LEFT 11.09 00:57.6 (Left: Eye)  Patient location during evaluation: PACU Anesthesia Type: MAC Level of consciousness: awake and alert Pain management: pain level controlled Vital Signs Assessment: post-procedure vital signs reviewed and stable Respiratory status: spontaneous breathing, nonlabored ventilation, respiratory function stable and patient connected to nasal cannula oxygen Cardiovascular status: blood pressure returned to baseline and stable Postop Assessment: no apparent nausea or vomiting Anesthetic complications: no   No notable events documented.   Last Vitals:  Vitals:   01/08/22 1250 01/08/22 1253  BP: 138/85   Pulse: 63 66  Resp: 18 17  Temp:    SpO2: 94% 93%    Last Pain:  Vitals:   01/08/22 1250  TempSrc:   PainSc: 0-No pain                 Molli Barrows

## 2022-01-08 NOTE — H&P (Signed)
[facility]   Primary Care Physician:  Latanya Maudlin, NP Ophthalmologist: Dr. Benay Pillow  Pre-Procedure History & Physical: HPI:  Arthur Owens is a 81 y.o. male here for cataract surgery.   Past Medical History:  Diagnosis Date   ADD (attention deficit disorder)    Anxiety    Bladder neck obstruction    Cancer (HCC)    H/O ADENOMATOUS POLYP OF COLON   COPD (chronic obstructive pulmonary disease) (HCC)    GERD (gastroesophageal reflux disease)    HTN (hypertension)    Hyperlipidemia    Seasonal allergies    Shortness of breath dyspnea     Past Surgical History:  Procedure Laterality Date   adentamous colon polyp     APPENDECTOMY     COLONOSCOPY WITH PROPOFOL N/A 01/28/2015   Procedure: COLONOSCOPY WITH PROPOFOL;  Surgeon: Manya Silvas, MD;  Location: [place];  Service: Endoscopy;  Laterality: N/A;   COLONOSCOPY WITH PROPOFOL N/A 09/23/2020   Procedure: COLONOSCOPY WITH PROPOFOL;  Surgeon: [name], MD;  Location: [facility] ENDOSCOPY;  Service: Endoscopy;  Laterality: N/A;   ESOPHAGOGASTRODUODENOSCOPY (EGD) WITH PROPOFOL N/A 01/28/2015   Procedure: ESOPHAGOGASTRODUODENOSCOPY (EGD) WITH PROPOFOL;  Surgeon: Manya Silvas, MD;  Location: [facility] ENDOSCOPY;  Service: Endoscopy;  Laterality: N/A;   HERNIA REPAIR Left    LEFT INGUINAL   left humerus debridement Left    TONSILLECTOMY     TRIGGER FINGER RELEASE Left 04/15/2015   Procedure: LEFT RING FINGER DUPUYTREN RELEASE;  Surgeon: Leanor Kail, MD;  Location: [place];  Service: Orthopedics;  Laterality: Left;    Prior to Admission medications   Medication Sig Start Date End Date Taking? Authorizing Provider  albuterol (PROVENTIL HFA;VENTOLIN HFA) 108 (90 BASE) MCG/ACT inhaler Inhale 2 puffs into the lungs every 6 (six) hours as needed for wheezing or shortness of breath.   Yes [provider]  atorvastatin (LIPITOR) 40 MG tablet Take 40 mg by mouth daily. PM   Yes [provider]  cetirizine (ZYRTEC) 10 MG tablet Take 10 mg by mouth daily. AM   Yes [provider]  Cholecalciferol (VITAMIN D3) 50 MCG (2000 UT) capsule Take 2,000 Units by mouth daily. 10/15/18  Yes [provider]  famotidine (PEPCID) 20 MG tablet Take 20 mg by mouth daily. 04/14/18  Yes [provider]  finasteride (PROSCAR) 5 MG tablet Take 1 tablet (5 mg total) by mouth daily. Appt needed for further refills 04/17/21  Yes McGowan, Larene Beach A, PA-C  fluticasone (FLONASE) 50 MCG/ACT nasal spray Place 2 sprays into both nostrils as needed.    Yes [provider]  hydrochlorothiazide (HYDRODIURIL) 12.5 MG tablet Take 12.5 mg by mouth daily. AM   Yes [provider]  tamsulosin (FLOMAX) 0.4 MG CAPS capsule Take 1 capsule (0.4 mg total) by mouth daily. EVE 04/17/21  Yes McGowan, Larene Beach A, PA-C  tiotropium (SPIRIVA) 18 MCG inhalation capsule Place 18 mcg into inhaler and inhale daily. Pt uses as needed   Yes [provider]  vitamin B-12 (CYANOCOBALAMIN) 1000 MCG tablet Take 1,000 mcg by mouth daily.   Yes [provider]  mirabegron ER (MYRBETRIQ) 50 MG TB24 tablet Take 1 tablet (50 mg total) by mouth daily. Patient not taking: Reported on 01/02/2022 05/08/21   Zara Council A, PA-C    Allergies as of 11/21/2021 - Review Complete 05/09/2021  Allergen Reaction Noted   Hydrocodone bit-homatrop mbr Nausea And Vomiting 01/27/2015    Family History  Problem  Relation Age of Onset   Colon cancer Father    Prostate cancer Father    Stroke Mother    Diabetes Other    Hyperlipidemia Other    Glaucoma Other    Kidney disease Neg Hx    Kidney cancer Neg Hx    Bladder Cancer Neg Hx     Social History   Socioeconomic History   Marital status: Married    Spouse name: Not on file   Number of children: Not on file   Years of education: Not on file   Highest education level: Not on file  Occupational History   Not on file  Tobacco  Use   Smoking status: Former    Packs/day: 2.00    Years: 15.00    Total pack years: 30.00    Types: Cigarettes   Smokeless tobacco: Never   Tobacco comments:    quit 2 years  Vaping Use   Vaping Use: Never used  Substance and Sexual Activity   Alcohol use: Yes    Alcohol/week: 2.0 standard drinks of alcohol    Types: 2 Glasses of wine per week   Drug use: No   Sexual activity: Not on file  Other Topics Concern   Not on file  Social History Narrative   Not on file   Social Determinants of Health   Financial Resource Strain: Not on file  Food Insecurity: Not on file  Transportation Needs: Not on file  Physical Activity: Not on file  Stress: Not on file  Social Connections: Not on file  Intimate Partner Violence: Not on file    Review of Systems: See HPI, otherwise negative ROS  Physical Exam: BP (!) 167/81   Pulse 66   Temp (!) 97.5 F (36.4 C) (Temporal)   Ht '5\' 8"'$  (1.727 m)   Wt 85.2 kg   SpO2 96%   BMI 28.57 kg/m  General:   Alert, cooperative in NAD Head:  Normocephalic and atraumatic. Respiratory:  Normal work of breathing. Cardiovascular:  RRR  Impression/Plan: Arthur Owens is here for cataract surgery.  Risks, benefits, limitations, and alternatives regarding cataract surgery have been reviewed with the patient.  Questions have been answered.  All parties agreeable.   Benay Pillow, MD  01/08/2022, 12:14 PM

## 2022-01-08 NOTE — Transfer of Care (Signed)
Immediate Anesthesia Transfer of Care Note  Patient: Arthur Owens  Procedure(s) Performed: CATARACT EXTRACTION PHACO AND INTRAOCULAR LENS PLACEMENT (IOC) LEFT 11.09 00:57.6 (Left: Eye)  Patient Location: PACU  Anesthesia Type: MAC  Level of Consciousness: awake, alert  and patient cooperative  Airway and Oxygen Therapy: Patient Spontanous Breathing and Patient connected to supplemental oxygen  Post-op Assessment: Post-op Vital signs reviewed, Patient's Cardiovascular Status Stable, Respiratory Function Stable, Patent Airway and No signs of Nausea or vomiting  Post-op Vital Signs: Reviewed and stable  Complications: No notable events documented.

## 2022-01-08 NOTE — Anesthesia Preprocedure Evaluation (Signed)
Anesthesia Evaluation  Patient identified by MRN, date of birth, ID band Patient awake    Reviewed: Allergy & Precautions, H&P , NPO status , Patient's Chart, lab work & pertinent test results, reviewed documented beta blocker date and time   Airway Mallampati: II  TM Distance: >3 FB Neck ROM: full    Dental no notable dental hx. (+) Teeth Intact   Pulmonary shortness of breath, COPD, former smoker,    Pulmonary exam normal breath sounds clear to auscultation       Cardiovascular Exercise Tolerance: Good hypertension, On Medications negative cardio ROS   Rhythm:regular Rate:Normal     Neuro/Psych PSYCHIATRIC DISORDERS Anxiety negative neurological ROS     GI/Hepatic Neg liver ROS, GERD  Medicated,  Endo/Other  negative endocrine ROSdiabetes  Renal/GU      Musculoskeletal   Abdominal   Peds  Hematology negative hematology ROS (+)   Anesthesia Other Findings   Reproductive/Obstetrics negative OB ROS                             Anesthesia Physical Anesthesia Plan  ASA: 3  Anesthesia Plan: MAC   Post-op Pain Management:    Induction:   PONV Risk Score and Plan:   Airway Management Planned:   Additional Equipment:   Intra-op Plan:   Post-operative Plan:   Informed Consent: I have reviewed the patients History and Physical, chart, labs and discussed the procedure including the risks, benefits and alternatives for the proposed anesthesia with the patient or authorized representative who has indicated his/her understanding and acceptance.       Plan Discussed with: CRNA  Anesthesia Plan Comments:         Anesthesia Quick Evaluation

## 2022-01-08 NOTE — Op Note (Signed)
OPERATIVE NOTE  Horton Ellithorpe [id number] 01/08/2022   PREOPERATIVE DIAGNOSIS:  Nuclear sclerotic cataract left eye.  H25.12   POSTOPERATIVE DIAGNOSIS:    Nuclear sclerotic cataract left eye.     PROCEDURE:  Phacoemusification with posterior chamber intraocular lens placement of the left eye   LENS:   Implant Name Type Inv. Item Serial No. Manufacturer Lot No. LRB No. Used Action  LENS IOL TECNIS EYHANCE 23.0 - [id number] Intraocular Lens LENS IOL TECNIS EYHANCE 23.0 [id number] [employer]  Left 1 Implanted      Procedure(s): CATARACT EXTRACTION PHACO AND INTRAOCULAR LENS PLACEMENT (IOC) LEFT 11.09 00:57.6 (Left)  DIB00 +23.0   ULTRASOUND TIME: 0 minutes 57 seconds.  CDE 11.09   SURGEON:  Benay Pillow, MD, MPH   ANESTHESIA:  Topical with tetracaine drops augmented with 1% preservative-free intracameral lidocaine.  ESTIMATED BLOOD LOSS: <1 mL   COMPLICATIONS:  None.   DESCRIPTION OF PROCEDURE:  The patient was identified in the holding room and transported to the operating room and placed in the supine position under the operating microscope.  The left eye was identified as the operative eye and it was prepped and draped in the usual sterile ophthalmic fashion.   A 1.0 millimeter clear-corneal paracentesis was made at the 5:00 position. 0.5 ml of preservative-free 1% lidocaine with epinephrine was injected into the anterior chamber.  The anterior chamber was filled with viscoelastic.  A 2.4 millimeter keratome was used to make a near-clear corneal incision at the 2:00 position.  A curvilinear capsulorrhexis was made with a cystotome and capsulorrhexis forceps.  Balanced salt solution was used to hydrodissect and hydrodelineate the nucleus.   Phacoemulsification was then used in stop and chop fashion to remove the lens nucleus and epinucleus.  The remaining cortex was then removed using the irrigation and aspiration handpiece. Viscoelastic was then placed into the capsular bag to  distend it for lens placement.  A lens was then injected into the capsular bag.  The remaining viscoelastic was aspirated.   Wounds were hydrated with balanced salt solution.  The anterior chamber was inflated to a physiologic pressure with balanced salt solution.  Intracameral vigamox 0.1 mL undiltued was injected into the eye and a drop placed onto the ocular surface.  No wound leaks were noted.  The patient was taken to the recovery room in stable condition without complications of anesthesia or surgery  Benay Pillow 01/08/2022, 12:44 PM

## 2022-01-09 ENCOUNTER — Encounter: Payer: Self-pay | Admitting: Ophthalmology

## 2022-01-11 ENCOUNTER — Encounter: Payer: Self-pay | Admitting: Ophthalmology

## 2022-01-17 NOTE — Discharge Instructions (Signed)

## 2022-01-19 DIAGNOSIS — H2511 Age-related nuclear cataract, right eye: Secondary | ICD-10-CM | POA: Diagnosis not present

## 2022-01-19 NOTE — Anesthesia Preprocedure Evaluation (Signed)
Anesthesia Evaluation  Patient identified by MRN, date of birth, ID band Patient awake    Reviewed: Allergy & Precautions, H&P , NPO status , Patient's Chart, lab work & pertinent test results, reviewed documented beta blocker date and time   Airway Mallampati: II  TM Distance: >3 FB Neck ROM: full    Dental no notable dental hx. (+) Teeth Intact   Pulmonary shortness of breath, COPD, former smoker,    Pulmonary exam normal breath sounds clear to auscultation       Cardiovascular Exercise Tolerance: Good hypertension, On Medications negative cardio ROS   Rhythm:regular Rate:Normal     Neuro/Psych PSYCHIATRIC DISORDERS Anxiety negative neurological ROS     GI/Hepatic Neg liver ROS, GERD  Medicated,  Endo/Other  negative endocrine ROSdiabetes  Renal/GU      Musculoskeletal   Abdominal   Peds  Hematology negative hematology ROS (+)   Anesthesia Other Findings   Reproductive/Obstetrics negative OB ROS                             Anesthesia Physical  Anesthesia Plan  ASA: 3  Anesthesia Plan: MAC   Post-op Pain Management:    Induction:   PONV Risk Score and Plan:   Airway Management Planned:   Additional Equipment:   Intra-op Plan:   Post-operative Plan:   Informed Consent:   Plan Discussed with: CRNA  Anesthesia Plan Comments:         Anesthesia Quick Evaluation

## 2022-01-22 ENCOUNTER — Ambulatory Visit (AMBULATORY_SURGERY_CENTER): Payer: PPO | Admitting: Anesthesiology

## 2022-01-22 ENCOUNTER — Encounter
Admission: RE | Disposition: A | Discharge status: Home / Self Care | Payer: Self-pay | Source: Home / Self Care | Attending: Ophthalmology

## 2022-01-22 ENCOUNTER — Encounter: Payer: Self-pay | Admitting: Ophthalmology

## 2022-01-22 ENCOUNTER — Ambulatory Visit: Payer: PPO | Admitting: Anesthesiology

## 2022-01-22 ENCOUNTER — Ambulatory Visit
Admission: RE | Admit: 2022-01-22 | Discharge: 2022-01-22 | Disposition: A | Discharge status: Home / Self Care | Payer: PPO | Attending: Ophthalmology | Admitting: Ophthalmology

## 2022-01-22 ENCOUNTER — Other Ambulatory Visit: Payer: Self-pay

## 2022-01-22 DIAGNOSIS — I10 Essential (primary) hypertension: Secondary | ICD-10-CM | POA: Insufficient documentation

## 2022-01-22 DIAGNOSIS — Z87891 Personal history of nicotine dependence: Secondary | ICD-10-CM | POA: Diagnosis not present

## 2022-01-22 DIAGNOSIS — K219 Gastro-esophageal reflux disease without esophagitis: Secondary | ICD-10-CM | POA: Diagnosis not present

## 2022-01-22 DIAGNOSIS — J449 Chronic obstructive pulmonary disease, unspecified: Secondary | ICD-10-CM

## 2022-01-22 DIAGNOSIS — H2511 Age-related nuclear cataract, right eye: Secondary | ICD-10-CM

## 2022-01-22 DIAGNOSIS — F419 Anxiety disorder, unspecified: Secondary | ICD-10-CM | POA: Diagnosis not present

## 2022-01-22 DIAGNOSIS — R0602 Shortness of breath: Secondary | ICD-10-CM | POA: Diagnosis not present

## 2022-01-22 HISTORY — PX: CATARACT EXTRACTION W/PHACO: SHX586

## 2022-01-22 SURGERY — PHACOEMULSIFICATION, CATARACT, WITH IOL INSERTION
Anesthesia: Monitor Anesthesia Care | Site: Eye | Laterality: Right

## 2022-01-22 MED ORDER — SIGHTPATH DOSE#1 NA HYALUR & NA CHOND-NA HYALUR IO KIT
PACK | INTRAOCULAR | Status: DC | PRN
  Administered 2022-01-22: 1 via OPHTHALMIC

## 2022-01-22 MED ORDER — TETRACAINE HCL 0.5 % OP SOLN
1.0000 [drp] | OPHTHALMIC | Status: DC | PRN
  Administered 2022-01-22 (×3): 1 [drp] via OPHTHALMIC

## 2022-01-22 MED ORDER — SIGHTPATH DOSE#1 BSS IO SOLN
INTRAOCULAR | Status: DC | PRN
  Administered 2022-01-22: 15 mL

## 2022-01-22 MED ORDER — ARMC OPHTHALMIC DILATING DROPS
1.0000 | OPHTHALMIC | Status: DC | PRN
  Administered 2022-01-22 (×3): 1 via OPHTHALMIC

## 2022-01-22 MED ORDER — FENTANYL CITRATE (PF) 100 MCG/2ML IJ SOLN
INTRAMUSCULAR | Status: DC | PRN
  Administered 2022-01-22: 100 ug via INTRAVENOUS

## 2022-01-22 MED ORDER — MIDAZOLAM HCL 2 MG/2ML IJ SOLN
INTRAMUSCULAR | Status: DC | PRN
  Administered 2022-01-22: 2 mg via INTRAVENOUS

## 2022-01-22 MED ORDER — MOXIFLOXACIN HCL 0.5 % OP SOLN
OPHTHALMIC | Status: DC | PRN
  Administered 2022-01-22: 0.2 mL via OPHTHALMIC

## 2022-01-22 MED ORDER — SIGHTPATH DOSE#1 BSS IO SOLN
INTRAOCULAR | Status: DC | PRN
  Administered 2022-01-22: 88 mL via OPHTHALMIC

## 2022-01-22 MED ORDER — LIDOCAINE HCL (PF) 2 % IJ SOLN
INTRAOCULAR | Status: DC | PRN
  Administered 2022-01-22: 1 mL via INTRAOCULAR

## 2022-01-22 SURGICAL SUPPLY — 13 items
CATARACT SUITE SIGHTPATH (MISCELLANEOUS) ×1 IMPLANT
DISSECTOR HYDRO NUCLEUS 50X22 (MISCELLANEOUS) ×1 IMPLANT
FEE CATARACT SUITE SIGHTPATH (MISCELLANEOUS) ×1 IMPLANT
GLOVE SURG GAMMEX PI TX LF 7.5 (GLOVE) ×1 IMPLANT
GLOVE SURG SYN 8.5  E (GLOVE) ×1
GLOVE SURG SYN 8.5 E (GLOVE) ×1 IMPLANT
GLOVE SURG SYN 8.5 PF PI (GLOVE) ×1 IMPLANT
LENS IOL TECNIS EYHANCE 25.0 (Intraocular Lens) IMPLANT
NDL FILTER BLUNT 18X1 1/2 (NEEDLE) ×1 IMPLANT
NEEDLE FILTER BLUNT 18X1 1/2 (NEEDLE) ×1 IMPLANT
SYR 3ML LL SCALE MARK (SYRINGE) ×1 IMPLANT
SYR 5ML LL (SYRINGE) ×1 IMPLANT
WATER STERILE IRR 250ML POUR (IV SOLUTION) ×1 IMPLANT

## 2022-01-22 NOTE — H&P (Signed)
[facility]   Primary Care Physician:  Latanya Maudlin, NP Ophthalmologist: Dr. Benay Pillow  Pre-Procedure History & Physical: HPI:  Arthur Owens is a 81 y.o. male here for cataract surgery.   Past Medical History:  Diagnosis Date   ADD (attention deficit disorder)    Anxiety    Bladder neck obstruction    Cancer (HCC)    H/O ADENOMATOUS POLYP OF COLON   COPD (chronic obstructive pulmonary disease) (HCC)    GERD (gastroesophageal reflux disease)    HTN (hypertension)    Hyperlipidemia    Seasonal allergies    Shortness of breath dyspnea     Past Surgical History:  Procedure Laterality Date   adentamous colon polyp     APPENDECTOMY     CATARACT EXTRACTION W/PHACO Left 01/08/2022   Procedure: CATARACT EXTRACTION PHACO AND INTRAOCULAR LENS PLACEMENT (IOC) LEFT 11.09 00:57.6;  Surgeon: Eulogio Bear, MD;  Location: [place];  Service: Ophthalmology;  Laterality: Left;   COLONOSCOPY WITH PROPOFOL N/A 01/28/2015   Procedure: COLONOSCOPY WITH PROPOFOL;  Surgeon: Manya Silvas, MD;  Location: [facility] ENDOSCOPY;  Service: Endoscopy;  Laterality: N/A;   COLONOSCOPY WITH PROPOFOL N/A 09/23/2020   Procedure: COLONOSCOPY WITH PROPOFOL;  Surgeon: [name], MD;  Location: [facility] ENDOSCOPY;  Service: Endoscopy;  Laterality: N/A;   ESOPHAGOGASTRODUODENOSCOPY (EGD) WITH PROPOFOL N/A 01/28/2015   Procedure: ESOPHAGOGASTRODUODENOSCOPY (EGD) WITH PROPOFOL;  Surgeon: Manya Silvas, MD;  Location: [facility] ENDOSCOPY;  Service: Endoscopy;  Laterality: N/A;   HERNIA REPAIR Left    LEFT INGUINAL   left humerus debridement Left    TONSILLECTOMY     TRIGGER FINGER RELEASE Left 04/15/2015   Procedure: LEFT RING FINGER DUPUYTREN RELEASE;  Surgeon: Leanor Kail, MD;  Location: [place];  Service: Orthopedics;  Laterality: Left;    Prior to Admission medications   Medication Sig Start Date End Date Taking? Authorizing Provider  albuterol (PROVENTIL  HFA;VENTOLIN HFA) 108 (90 BASE) MCG/ACT inhaler Inhale 2 puffs into the lungs every 6 (six) hours as needed for wheezing or shortness of breath.   Yes [provider]  atorvastatin (LIPITOR) 40 MG tablet Take 40 mg by mouth daily. PM   Yes [provider]  cetirizine (ZYRTEC) 10 MG tablet Take 10 mg by mouth daily. AM   Yes [provider]  Cholecalciferol (VITAMIN D3) 50 MCG (2000 UT) capsule Take 2,000 Units by mouth daily. 10/15/18  Yes [provider]  famotidine (PEPCID) 20 MG tablet Take 20 mg by mouth daily. 04/14/18  Yes [provider]  finasteride (PROSCAR) 5 MG tablet Take 1 tablet (5 mg total) by mouth daily. Appt needed for further refills 04/17/21  Yes McGowan, Larene Beach A, PA-C  fluticasone (FLONASE) 50 MCG/ACT nasal spray Place 2 sprays into both nostrils as needed.    Yes [provider]  hydrochlorothiazide (HYDRODIURIL) 12.5 MG tablet Take 12.5 mg by mouth daily. AM   Yes [provider]  tamsulosin (FLOMAX) 0.4 MG CAPS capsule Take 1 capsule (0.4 mg total) by mouth daily. EVE 04/17/21  Yes McGowan, Larene Beach A, PA-C  tiotropium (SPIRIVA) 18 MCG inhalation capsule Place 18 mcg into inhaler and inhale daily. Pt uses as needed   Yes [provider]  vitamin B-12 (CYANOCOBALAMIN) 1000 MCG tablet Take 1,000 mcg by mouth daily.   Yes [provider]  mirabegron ER (MYRBETRIQ) 50 MG TB24 tablet Take 1 tablet (50 mg total) by mouth daily. Patient not taking: Reported on 01/02/2022  05/08/21   Zara Council A, PA-C    Allergies as of 11/21/2021 - Review Complete 05/09/2021  Allergen Reaction Noted   Hydrocodone bit-homatrop mbr Nausea And Vomiting 01/27/2015    Family History  Problem Relation Age of Onset   Colon cancer Father    Prostate cancer Father    Stroke Mother    Diabetes Other    Hyperlipidemia Other    Glaucoma Other    Kidney disease Neg Hx    Kidney cancer Neg Hx    Bladder Cancer Neg Hx      Social History   Socioeconomic History   Marital status: Married    Spouse name: Not on file   Number of children: Not on file   Years of education: Not on file   Highest education level: Not on file  Occupational History   Not on file  Tobacco Use   Smoking status: Former    Packs/day: 2.00    Years: 15.00    Total pack years: 30.00    Types: Cigarettes   Smokeless tobacco: Never   Tobacco comments:    quit 2 years  Vaping Use   Vaping Use: Never used  Substance and Sexual Activity   Alcohol use: Yes    Alcohol/week: 2.0 standard drinks of alcohol    Types: 2 Glasses of wine per week   Drug use: No   Sexual activity: Not on file  Other Topics Concern   Not on file  Social History Narrative   Not on file   Social Determinants of Health   Financial Resource Strain: Not on file  Food Insecurity: Not on file  Transportation Needs: Not on file  Physical Activity: Not on file  Stress: Not on file  Social Connections: Not on file  Intimate Partner Violence: Not on file    Review of Systems: See HPI, otherwise negative ROS  Physical Exam: There were no vitals taken for this visit. General:   Alert, cooperative in NAD Head:  Normocephalic and atraumatic. Respiratory:  Normal work of breathing. Cardiovascular:  RRR  Impression/Plan: Arthur Owens is here for cataract surgery.  Risks, benefits, limitations, and alternatives regarding cataract surgery have been reviewed with the patient.  Questions have been answered.  All parties agreeable.   Benay Pillow, MD  01/22/2022, 11:20 AM

## 2022-01-22 NOTE — Anesthesia Postprocedure Evaluation (Signed)
Anesthesia Post Note  Patient: Arthur Owens  Procedure(s) Performed: CATARACT EXTRACTION PHACO AND INTRAOCULAR LENS PLACEMENT (IOC) RIGHT 13.52 01:05.6 (Right: Eye)     Patient location during evaluation: PACU Anesthesia Type: MAC Level of consciousness: awake and alert Pain management: pain level controlled Vital Signs Assessment: post-procedure vital signs reviewed and stable Respiratory status: spontaneous breathing, nonlabored ventilation and respiratory function stable Cardiovascular status: blood pressure returned to baseline and stable Postop Assessment: no apparent nausea or vomiting Anesthetic complications: no   No notable events documented.  Iran Ouch

## 2022-01-22 NOTE — Op Note (Signed)
OPERATIVE NOTE  Arthur Owens [id number] 01/22/2022   PREOPERATIVE DIAGNOSIS:  Nuclear sclerotic cataract right eye.  H25.11   POSTOPERATIVE DIAGNOSIS:    Nuclear sclerotic cataract right eye.     PROCEDURE:  Phacoemusification with posterior chamber intraocular lens placement of the right eye   LENS:   Implant Name Type Inv. Item Serial No. Manufacturer Lot No. LRB No. Used Action  LENS IOL TECNIS EYHANCE 25.0 - [id number] Intraocular Lens LENS IOL TECNIS EYHANCE 25.0 [id number] [employer]  Right 1 Implanted       Procedure(s): CATARACT EXTRACTION PHACO AND INTRAOCULAR LENS PLACEMENT (IOC) RIGHT 13.52 01:05.6 (Right)  DIB00 +25.0   ULTRASOUND TIME: 1 minutes 05 seconds.  CDE 13.52   SURGEON:  Benay Pillow, MD, MPH  ANESTHESIOLOGIST: Anesthesiologist: Iran Ouch, MD CRNA: Louanne Belton, CRNA   ANESTHESIA:  Topical with tetracaine drops augmented with 1% preservative-free intracameral lidocaine.  ESTIMATED BLOOD LOSS: less than 1 mL.   COMPLICATIONS:  None.   DESCRIPTION OF PROCEDURE:  The patient was identified in the holding room and transported to the operating room and placed in the supine position under the operating microscope.  The right eye was identified as the operative eye and it was prepped and draped in the usual sterile ophthalmic fashion.   A 1.0 millimeter clear-corneal paracentesis was made at the 10:30 position. 0.5 ml of preservative-free 1% lidocaine with epinephrine was injected into the anterior chamber.  The anterior chamber was filled with viscoelastic.  A 2.4 millimeter keratome was used to make a near-clear corneal incision at the 8:00 position.  A curvilinear capsulorrhexis was made with a cystotome and capsulorrhexis forceps.  Balanced salt solution was used to hydrodissect and hydrodelineate the nucleus.   Phacoemulsification was then used in stop and chop fashion to remove the lens nucleus and epinucleus.  The remaining cortex  was then removed using the irrigation and aspiration handpiece. Viscoelastic was then placed into the capsular bag to distend it for lens placement.  A lens was then injected into the capsular bag.  The remaining viscoelastic was aspirated.   Wounds were hydrated with balanced salt solution.  The anterior chamber was inflated to a physiologic pressure with balanced salt solution.   Intracameral vigamox 0.1 mL undiluted was injected into the eye and a drop placed onto the ocular surface.  No wound leaks were noted.  The patient was taken to the recovery room in stable condition without complications of anesthesia or surgery  Benay Pillow 01/22/2022, 11:47 AM

## 2022-01-22 NOTE — Transfer of Care (Signed)
Immediate Anesthesia Transfer of Care Note  Patient: Arthur Owens  Procedure(s) Performed: CATARACT EXTRACTION PHACO AND INTRAOCULAR LENS PLACEMENT (IOC) RIGHT 13.52 01:05.6 (Right: Eye)  Patient Location: PACU  Anesthesia Type: MAC  Level of Consciousness: awake, alert  and patient cooperative  Airway and Oxygen Therapy: Patient Spontanous Breathing and Patient connected to supplemental oxygen  Post-op Assessment: Post-op Vital signs reviewed, Patient's Cardiovascular Status Stable, Respiratory Function Stable, Patent Airway and No signs of Nausea or vomiting  Post-op Vital Signs: Reviewed and stable  Complications: No notable events documented.

## 2022-01-24 ENCOUNTER — Encounter: Payer: Self-pay | Admitting: Ophthalmology

## 2022-01-29 DIAGNOSIS — H353221 Exudative age-related macular degeneration, left eye, with active choroidal neovascularization: Secondary | ICD-10-CM | POA: Diagnosis not present

## 2022-02-01 DIAGNOSIS — Z23 Encounter for immunization: Secondary | ICD-10-CM | POA: Diagnosis not present

## 2022-02-06 ENCOUNTER — Other Ambulatory Visit: Payer: Self-pay | Admitting: Urology

## 2022-02-06 DIAGNOSIS — N138 Other obstructive and reflux uropathy: Secondary | ICD-10-CM

## 2022-02-21 DIAGNOSIS — R0602 Shortness of breath: Secondary | ICD-10-CM | POA: Diagnosis not present

## 2022-02-21 DIAGNOSIS — J449 Chronic obstructive pulmonary disease, unspecified: Secondary | ICD-10-CM | POA: Diagnosis not present

## 2022-04-03 DIAGNOSIS — R918 Other nonspecific abnormal finding of lung field: Secondary | ICD-10-CM | POA: Diagnosis not present

## 2022-04-03 DIAGNOSIS — E669 Obesity, unspecified: Secondary | ICD-10-CM | POA: Diagnosis not present

## 2022-04-03 DIAGNOSIS — J449 Chronic obstructive pulmonary disease, unspecified: Secondary | ICD-10-CM | POA: Diagnosis not present

## 2022-04-03 DIAGNOSIS — R55 Syncope and collapse: Secondary | ICD-10-CM | POA: Diagnosis not present

## 2022-04-03 DIAGNOSIS — I10 Essential (primary) hypertension: Secondary | ICD-10-CM | POA: Diagnosis not present

## 2022-04-03 DIAGNOSIS — E7800 Pure hypercholesterolemia, unspecified: Secondary | ICD-10-CM | POA: Diagnosis not present

## 2022-04-03 DIAGNOSIS — K219 Gastro-esophageal reflux disease without esophagitis: Secondary | ICD-10-CM | POA: Diagnosis not present

## 2022-04-09 DIAGNOSIS — H353221 Exudative age-related macular degeneration, left eye, with active choroidal neovascularization: Secondary | ICD-10-CM | POA: Diagnosis not present

## 2022-05-04 NOTE — Progress Notes (Unsigned)
05/07/2022 11:07 AM   Jake Samples [birth year] [id number]  Referring provider: Latanya Maudlin, NP [address] [place],  [place] [address]  Urological history: 1. BPH with LU TS -PSA (10/2021) 0.53 -I PSS *** -tamsulosin 0.4 mg daily and finasteride 5 mg daily  HPI: Arthur Owens is a 82 y.o. male who presents today for one year for follow up.       Score:  1-7 Mild 8-19 Moderate 20-35 Severe   PMH: Past Medical History:  Diagnosis Date   ADD (attention deficit disorder)    Anxiety    Bladder neck obstruction    Cancer (HCC)    H/O ADENOMATOUS POLYP OF COLON   COPD (chronic obstructive pulmonary disease) (HCC)    GERD (gastroesophageal reflux disease)    HTN (hypertension)    Hyperlipidemia    Seasonal allergies    Shortness of breath dyspnea     Surgical History: Past Surgical History:  Procedure Laterality Date   adentamous colon polyp     APPENDECTOMY     CATARACT EXTRACTION W/PHACO Left 01/08/2022   Procedure: CATARACT EXTRACTION PHACO AND INTRAOCULAR LENS PLACEMENT (IOC) LEFT 11.09 00:57.6;  Surgeon: Eulogio Bear, MD;  Location: [place];  Service: Ophthalmology;  Laterality: Left;   CATARACT EXTRACTION W/PHACO Right 01/22/2022   Procedure: CATARACT EXTRACTION PHACO AND INTRAOCULAR LENS PLACEMENT (IOC) RIGHT 13.52 01:05.6;  Surgeon: Eulogio Bear, MD;  Location: [place];  Service: Ophthalmology;  Laterality: Right;   COLONOSCOPY WITH PROPOFOL N/A 01/28/2015   Procedure: COLONOSCOPY WITH PROPOFOL;  Surgeon: Manya Silvas, MD;  Location: [facility] ENDOSCOPY;  Service: Endoscopy;  Laterality: N/A;   COLONOSCOPY WITH PROPOFOL N/A 09/23/2020   Procedure: COLONOSCOPY WITH PROPOFOL;  Surgeon: [name], MD;  Location: [facility] ENDOSCOPY;  Service: Endoscopy;  Laterality: N/A;   ESOPHAGOGASTRODUODENOSCOPY (EGD) WITH PROPOFOL N/A 01/28/2015   Procedure: ESOPHAGOGASTRODUODENOSCOPY (EGD) WITH PROPOFOL;  Surgeon: Manya Silvas, MD;  Location: [facility] ENDOSCOPY;  Service: Endoscopy;  Laterality: N/A;   HERNIA REPAIR Left    LEFT INGUINAL   left humerus debridement Left    TONSILLECTOMY     TRIGGER FINGER RELEASE Left 04/15/2015   Procedure: LEFT RING FINGER DUPUYTREN RELEASE;  Surgeon: Leanor Kail, MD;  Location: [place];  Service: Orthopedics;  Laterality: Left;    Home Medications:  Allergies as of 05/07/2022       Reactions   Hydrocodone Bit-homatrop Mbr Nausea And Vomiting        Medication List        Accurate as of May 04, 2022 11:07 AM. If you have any questions, ask your nurse or doctor.          albuterol 108 (90 Base) MCG/ACT inhaler Commonly known as: VENTOLIN HFA Inhale 2 puffs into the lungs every 6 (six) hours as needed for wheezing or shortness of breath.   atorvastatin 40 MG tablet Commonly known as: LIPITOR Take 40 mg by mouth daily. PM   cetirizine 10 MG tablet Commonly known as: ZYRTEC Take 10 mg by mouth daily. AM   cyanocobalamin 1000 MCG tablet Commonly known as: VITAMIN B12 Take 1,000 mcg by mouth daily.   famotidine 20 MG tablet Commonly known as: PEPCID Take 20 mg by mouth daily.   finasteride 5 MG tablet Commonly known as: PROSCAR TAKE 1 TABLET (5 MG TOTAL) BY MOUTH DAILY. APPT NEEDED FOR FURTHER REFILLS   fluticasone 50 MCG/ACT nasal spray Commonly known as: FLONASE Place 2 sprays  into both nostrils as needed.   hydrochlorothiazide 12.5 MG tablet Commonly known as: HYDRODIURIL Take 12.5 mg by mouth daily. AM   mirabegron ER 50 MG Tb24 tablet Commonly known as: Myrbetriq Take 1 tablet (50 mg total) by mouth daily.   tamsulosin 0.4 MG Caps capsule Commonly known as: FLOMAX Take 1 capsule (0.4 mg total) by mouth daily. EVE   tiotropium 18 MCG inhalation capsule Commonly known as: SPIRIVA Place 18 mcg into inhaler and inhale daily. Pt uses as needed   Vitamin D3 50 MCG (2000 UT) capsule Take 2,000 Units by mouth daily.         Allergies:  Allergies  Allergen Reactions   Hydrocodone Bit-Homatrop Mbr Nausea And Vomiting    Family History: Family History  Problem Relation Age of Onset   Colon cancer Father    Prostate cancer Father    Stroke Mother    Diabetes Other    Hyperlipidemia Other    Glaucoma Other    Kidney disease Neg Hx    Kidney cancer Neg Hx    Bladder Cancer Neg Hx     Social History:  reports that he has quit smoking. His smoking use included cigarettes. He has a 30.00 pack-year smoking history. He has never used smokeless tobacco. He reports current alcohol use of about 2.0 standard drinks of alcohol per week. He reports that he does not use drugs.  ROS: For pertinent review of systems please refer to history of present illness  Physical Exam: There were no vitals taken for this visit.  Constitutional:  Well nourished. Alert and oriented, No acute distress. HEENT: [place] AT, moist mucus membranes.  Trachea midline Cardiovascular: No clubbing, cyanosis, or edema. Respiratory: Normal respiratory effort, no increased work of breathing. GU: No CVA tenderness.  No bladder fullness or masses.  Patient with circumcised/uncircumcised phallus. ***Foreskin easily retracted***  Urethral meatus is patent.  No penile discharge. No penile lesions or rashes. Scrotum without lesions, cysts, rashes and/or edema.  Testicles are located scrotally bilaterally. No masses are appreciated in the testicles. Left and right epididymis are normal. Rectal: Patient with  normal sphincter tone. Anus and perineum without scarring or rashes. No rectal masses are appreciated. Prostate is approximately *** grams, *** nodules are appreciated. Seminal vesicles are normal. Neurologic: Grossly intact, no focal deficits, moving all 4 extremities. Psychiatric: Normal mood and affect.   Laboratory Data: Serum creatinine (10/2021) 1.2 Hemoglobin A1c (10/2021) 6.0 PSA (10/2021) 0.53  Pertinent  Imaging: N/A   Assessment & Plan:    1. Incontinence -Abated with Logan Bores -Patient brought in his insurance formulary and Logan Bores is not listed, but Myrbetriq 50 mg is covered by his insurance so I sent a prescription in for that -So he will start the Myrbetriq 50 mg daily and contact us with any issues  2. BPH with LU TS -continue tamsulosin 0.4 mg daily and finasteride 5 mg daily    No follow-ups on file.  [place] Ware, [place] [address] [place], [place] [address] [phone]

## 2022-05-07 ENCOUNTER — Encounter: Payer: Self-pay | Admitting: Urology

## 2022-05-07 ENCOUNTER — Ambulatory Visit (INDEPENDENT_AMBULATORY_CARE_PROVIDER_SITE_OTHER): Payer: PPO | Admitting: Urology

## 2022-05-07 VITALS — BP 123/55 | HR 78 | Ht 68.0 in | Wt 191.6 lb

## 2022-05-07 DIAGNOSIS — N138 Other obstructive and reflux uropathy: Secondary | ICD-10-CM

## 2022-05-07 DIAGNOSIS — R32 Unspecified urinary incontinence: Secondary | ICD-10-CM

## 2022-05-07 DIAGNOSIS — N401 Enlarged prostate with lower urinary tract symptoms: Secondary | ICD-10-CM

## 2022-05-07 MED ORDER — MIRABEGRON ER 50 MG PO TB24: 50.0000 mg | ORAL_TABLET | Freq: Every day | ORAL | 3 refills | Status: DC

## 2022-05-11 DIAGNOSIS — I10 Essential (primary) hypertension: Secondary | ICD-10-CM | POA: Diagnosis not present

## 2022-05-22 DIAGNOSIS — H353221 Exudative age-related macular degeneration, left eye, with active choroidal neovascularization: Secondary | ICD-10-CM | POA: Diagnosis not present

## 2022-05-22 IMAGING — CT CT MAXILLOFACIAL W/O CM
3 series · 15 of 47 positions shown, 18 images · non-contrast
Comparison: None.

CLINICAL DATA: Fall

EXAM:
CT HEAD WITHOUT CONTRAST
CT MAXILLOFACIAL WITHOUT CONTRAST
CT CERVICAL SPINE WITHOUT CONTRAST
TECHNIQUE: Multidetector CT imaging of the head, cervical spine, and
maxillofacial structures were performed using the standard protocol
without intravenous contrast. Multiplanar CT image reconstructions
of the cervical spine and maxillofacial structures were also
generated.

[Series 2: max soft · axial · 0.46mm/px · z∈[+224,+372]mm · 9 of 86 slices shown, 12 images]
[im 6/86  brain]
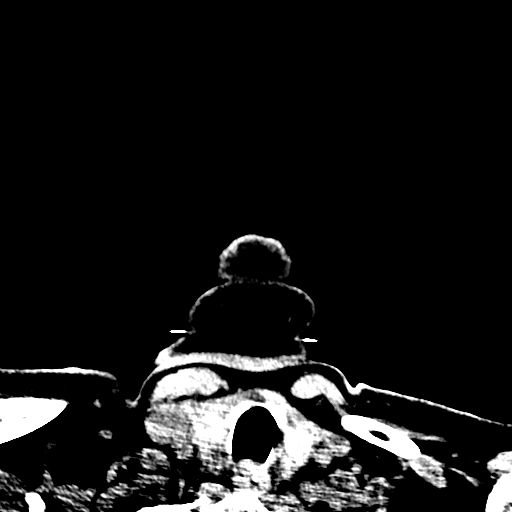
[im 6/86  bone]
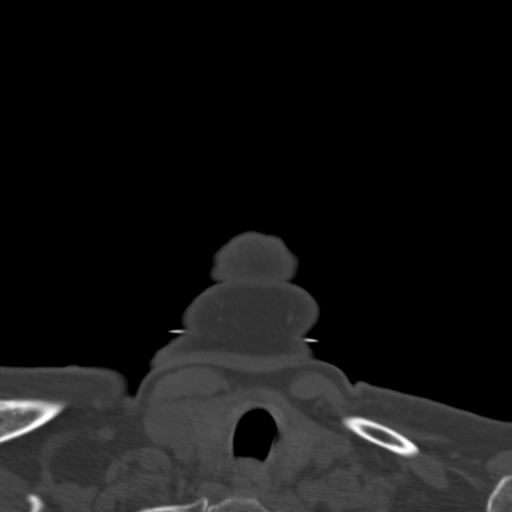
[im 15/86  bone]
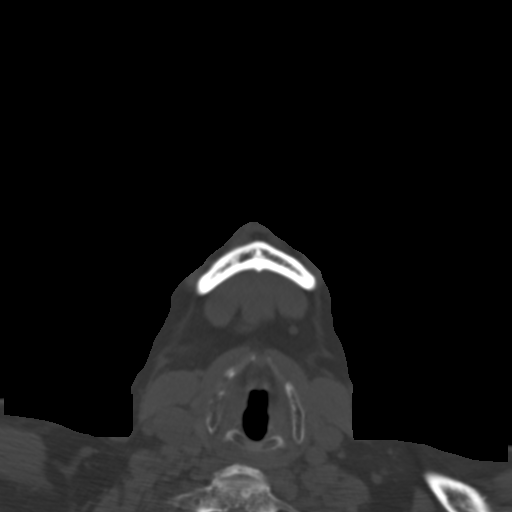
[im 24/86  bone]
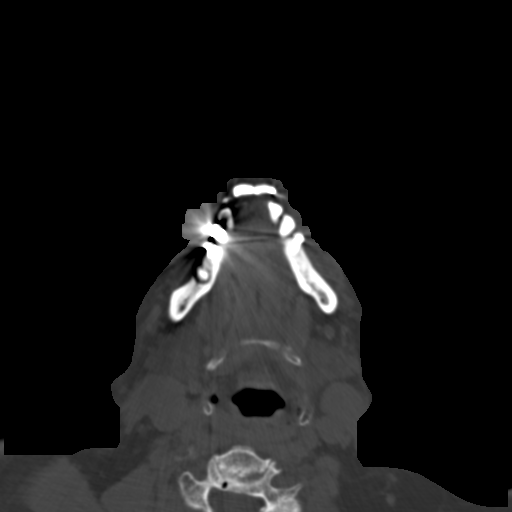
[im 33/86  bone]
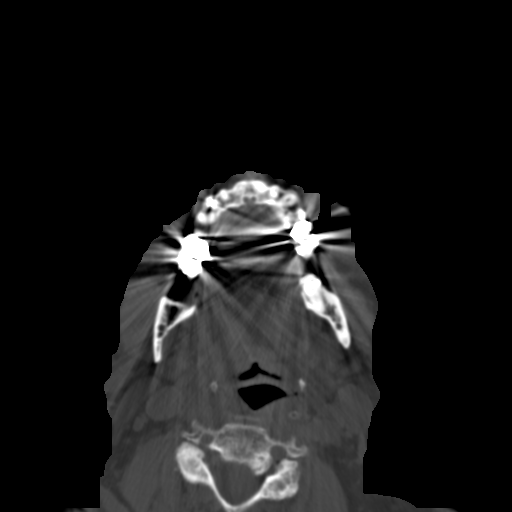
[im 44/86  brain]
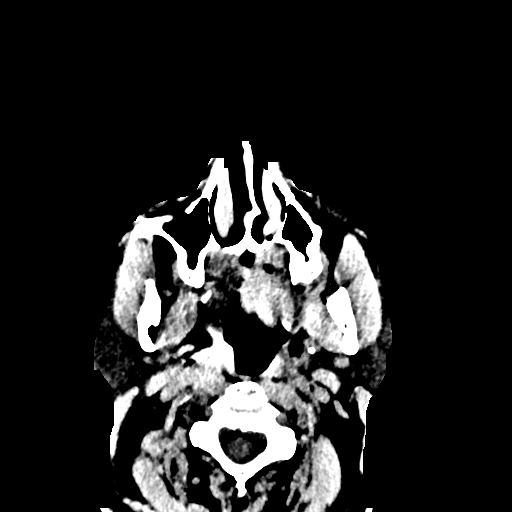
[im 44/86  bone]
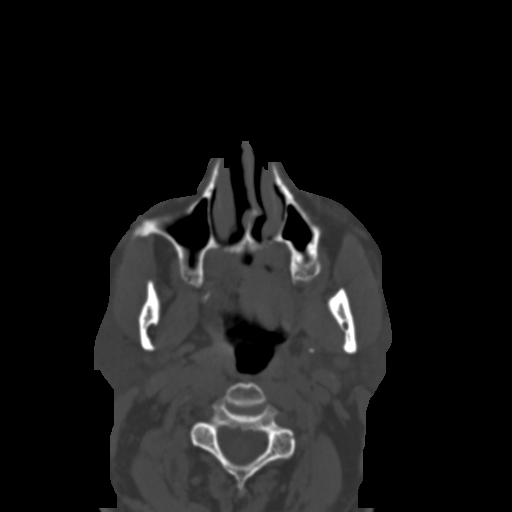
[im 53/86  bone]
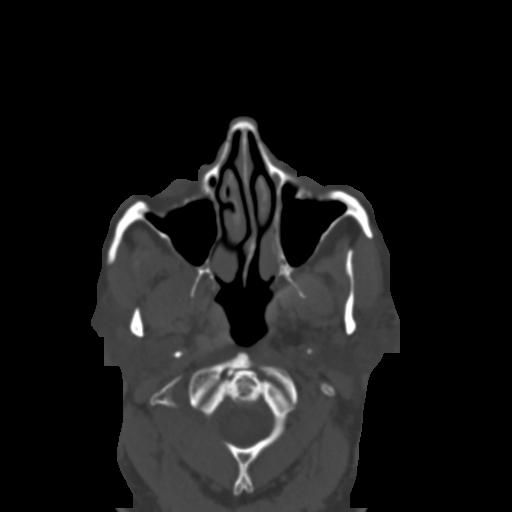
[im 62/86  bone]
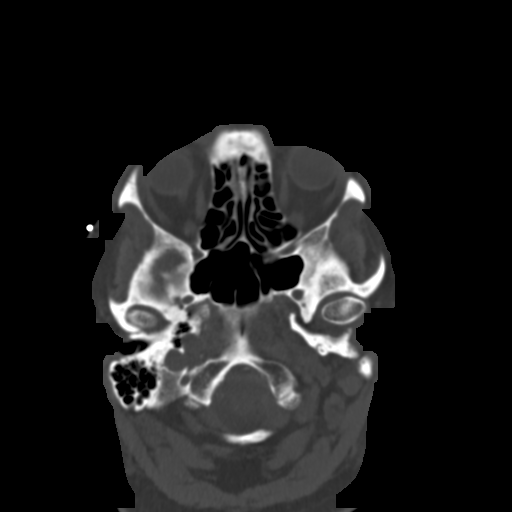
[im 71/86  bone]
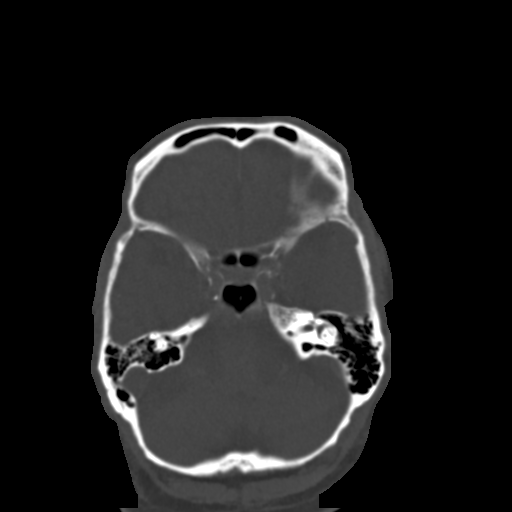
[im 80/86  brain]
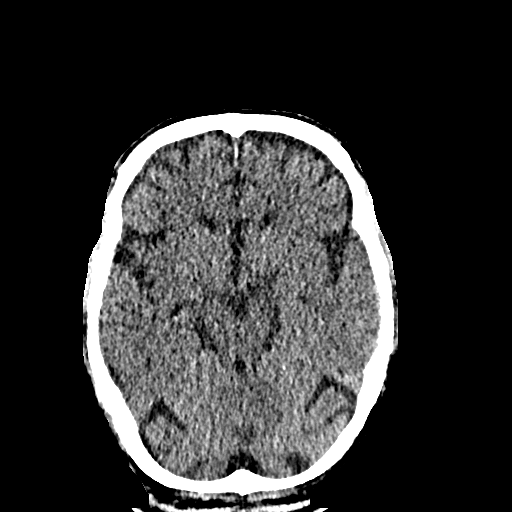
[im 80/86  bone]
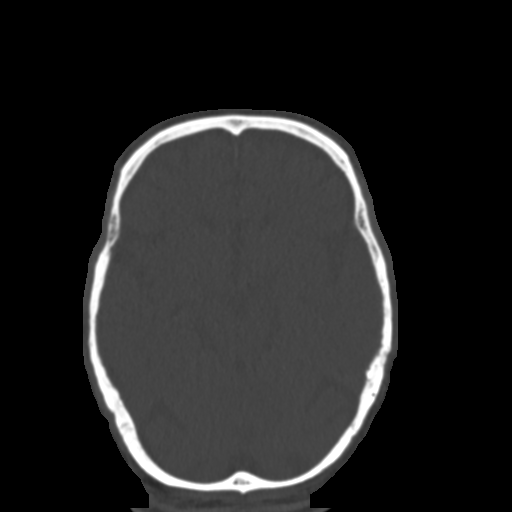

[Series 6: coronal soft · coronal · 0.37mm/px · 3 of 111 slices shown]
[im 37/111  bone]
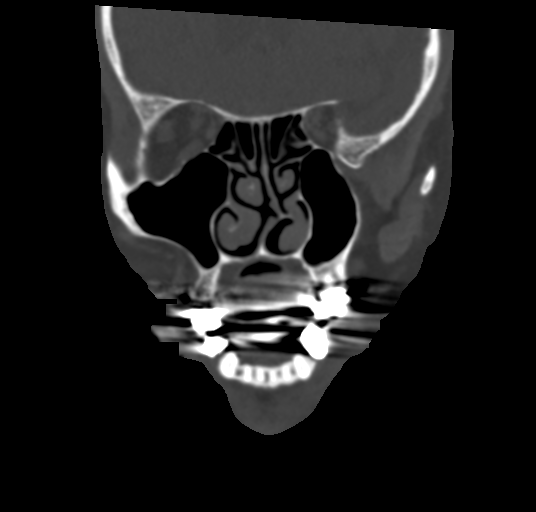
[im 49/111  bone]
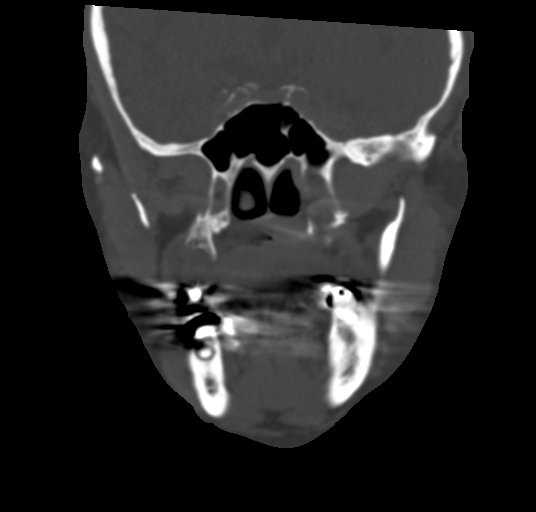
[im 62/111  bone]
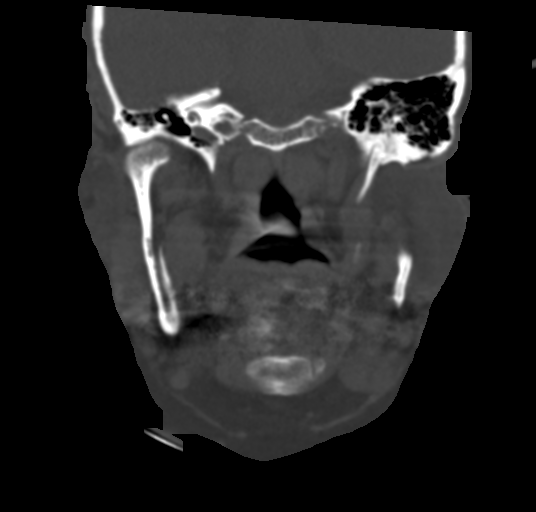

[Series 7: sagittal soft · sagittal · 0.36mm/px · 3 of 110 slices shown]
[im 37/110  bone]
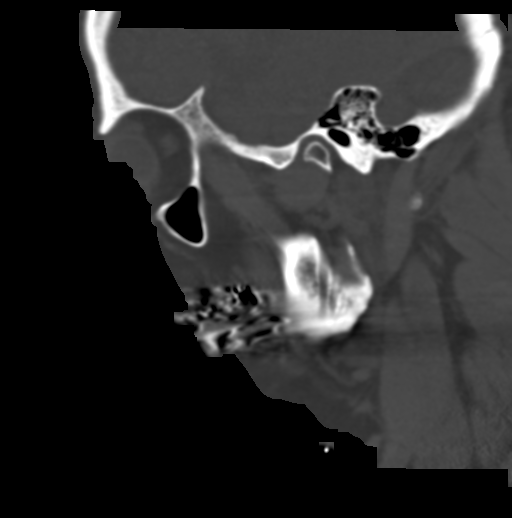
[im 55/110  bone]
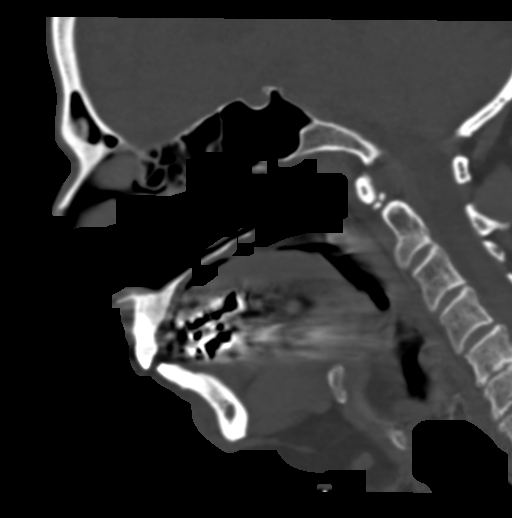
[im 73/110  bone]
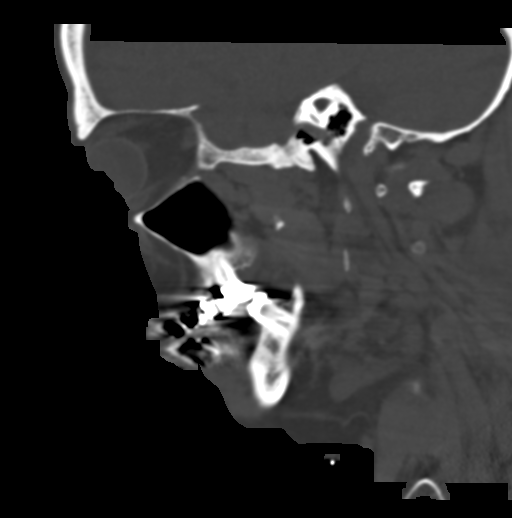

[15 of 47 positions shown; findings below may reference images not displayed]

FINDINGS: CT HEAD FINDINGS

Brain: There is no mass, hemorrhage or extra-axial collection. The
size and configuration of the ventricles and extra-axial CSF spaces
are normal. The brain parenchyma is normal, without evidence of
acute or chronic infarction.

Vascular: No abnormal hyperdensity of the major intracranial
arteries or dural venous sinuses. No intracranial atherosclerosis.

Skull: The visualized skull base, calvarium and extracranial soft
tissues are normal.

CT MAXILLOFACIAL FINDINGS

Osseous:

--Complex facial fracture types: No LeFort, zygomaticomaxillary
complex or nasoorbitoethmoidal fracture.

--Simple fracture types: None.

--Mandible: No fracture or dislocation.

Orbits: The globes are intact. Normal appearance of the intra- and
extraconal fat. Symmetric extraocular muscles and optic nerves.

Sinuses: No fluid levels or advanced mucosal thickening.

Soft tissues: Normal visualized extracranial soft tissues.

CT CERVICAL SPINE FINDINGS

Alignment: No static subluxation. Facets are aligned. Occipital
condyles and the lateral masses of C1-C2 are aligned.

Skull base and vertebrae: No acute fracture.

Soft tissues and spinal canal: No prevertebral fluid or swelling. No
visible canal hematoma.

Disc levels: No advanced spinal canal or neural foraminal stenosis.

Upper chest: No pneumothorax, pulmonary nodule or pleural effusion.

Other: Normal visualized paraspinal cervical soft tissues.
IMPRESSION: 1. No acute intracranial abnormality.
2. No facial fracture.
3. No acute fracture or static subluxation of the cervical spine.

## 2022-05-22 IMAGING — CT CT CERVICAL SPINE W/O CM
2 series · 13 of 27 positions shown, 16 images · non-contrast
Comparison: None.

CLINICAL DATA: Fall

EXAM:
CT HEAD WITHOUT CONTRAST
CT MAXILLOFACIAL WITHOUT CONTRAST
CT CERVICAL SPINE WITHOUT CONTRAST
TECHNIQUE: Multidetector CT imaging of the head, cervical spine, and
maxillofacial structures were performed using the standard protocol
without intravenous contrast. Multiplanar CT image reconstructions
of the cervical spine and maxillofacial structures were also
generated.

[Series 3: c spine soft · axial · 0.39mm/px · z∈[+206,+368]mm · 8 of 97 slices shown, 10 images]
[im 8/97  soft-tissue]
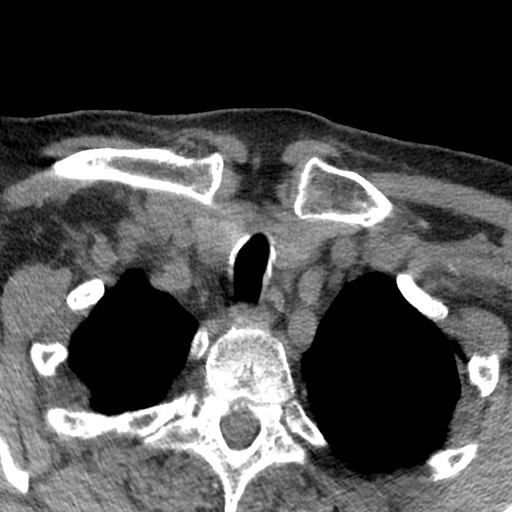
[im 8/97  bone]
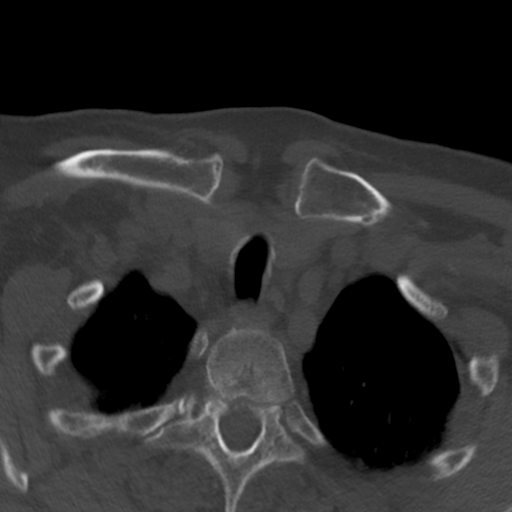
[im 23/97  bone]
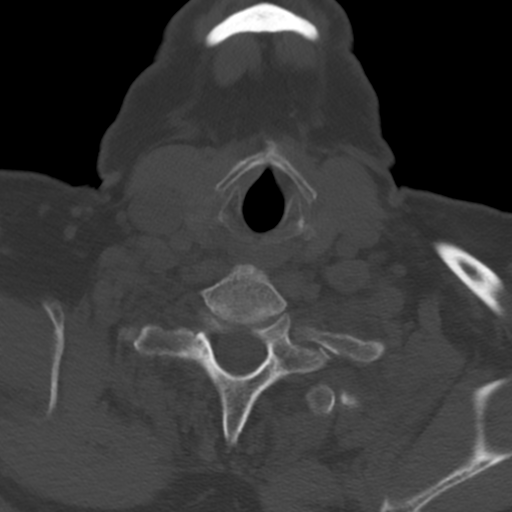
[im 30/97  bone]
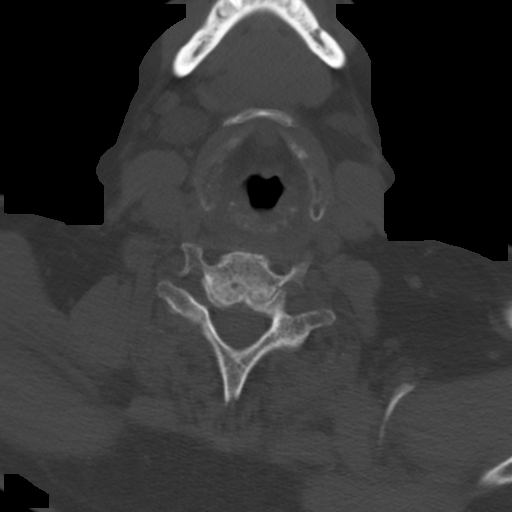
[im 45/97  bone]
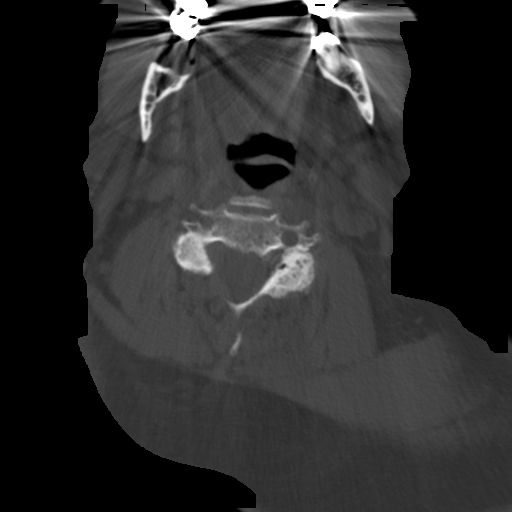
[im 52/97  soft-tissue]
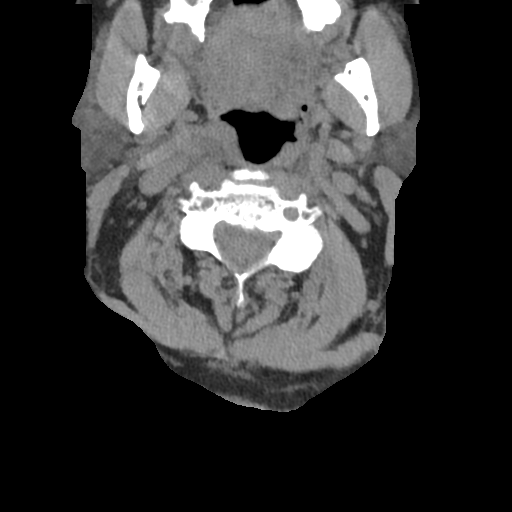
[im 52/97  bone]
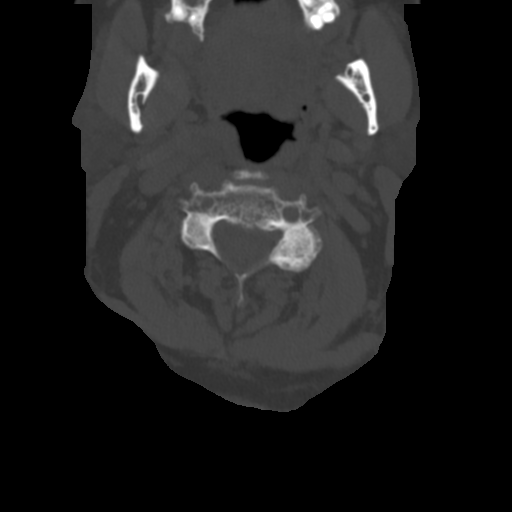
[im 67/97  bone]
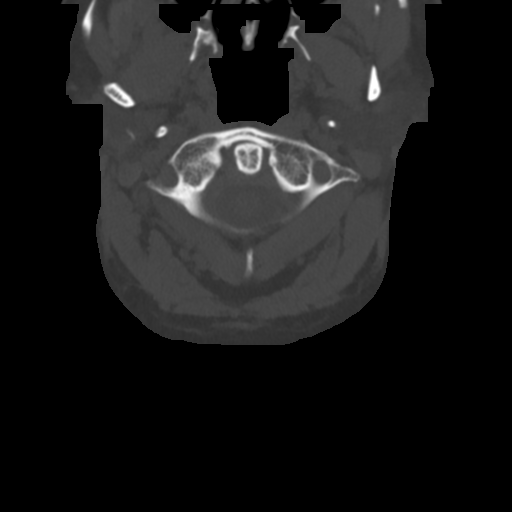
[im 74/97  bone]
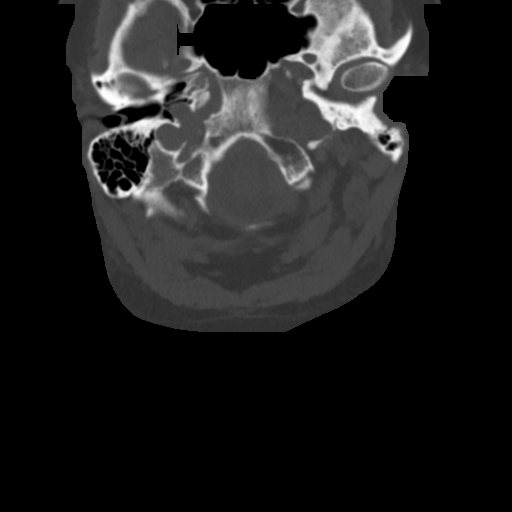
[im 89/97  bone]
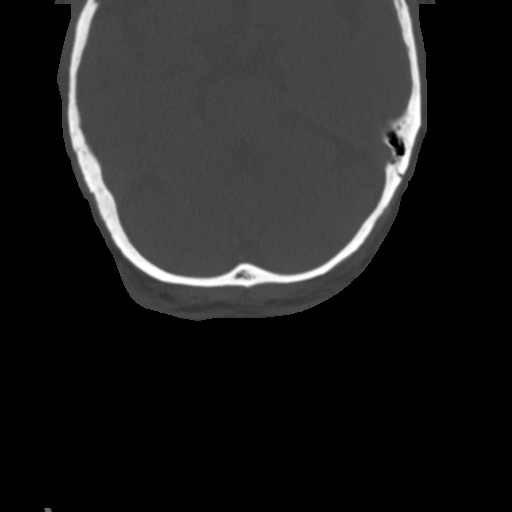

[Series 4: sagittal bone · sagittal · 0.39mm/px · 5 of 105 slices shown, 6 images]
[im 35/105  bone]
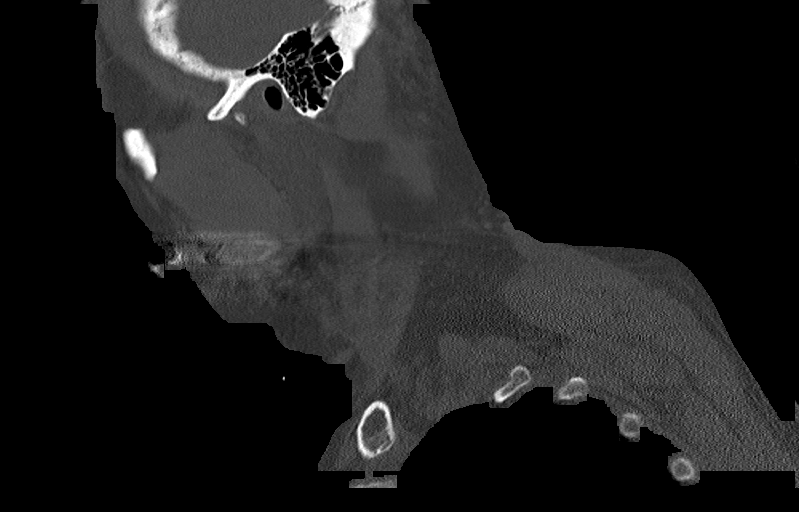
[im 44/105  bone]
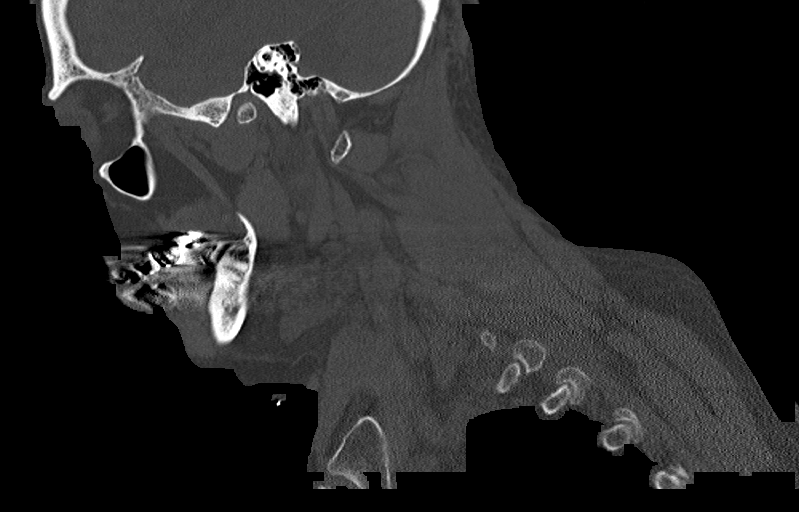
[im 53/105  soft-tissue]
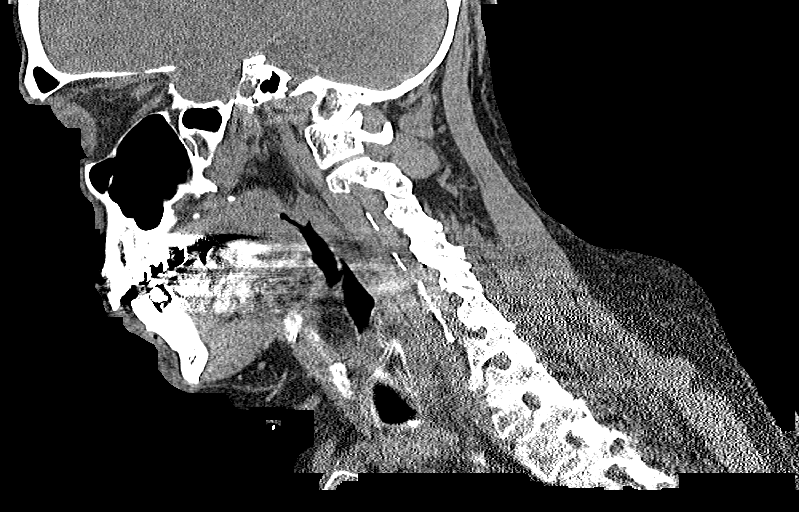
[im 53/105  bone]
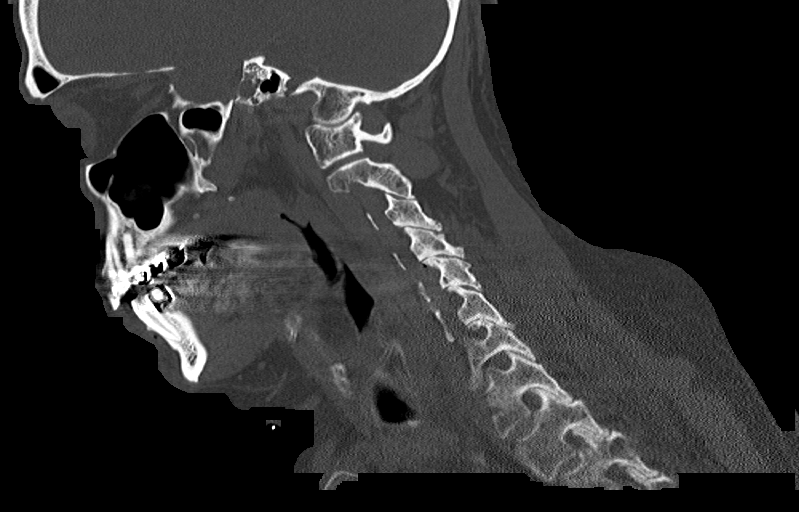
[im 61/105  bone]
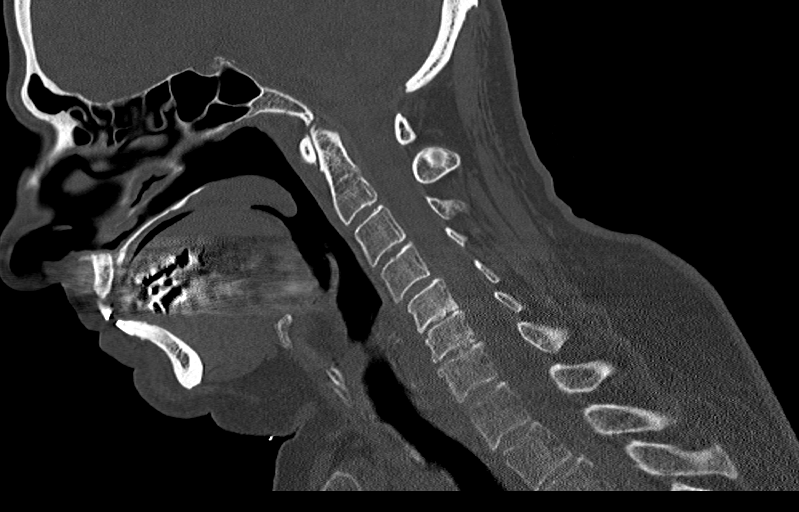
[im 70/105  bone]
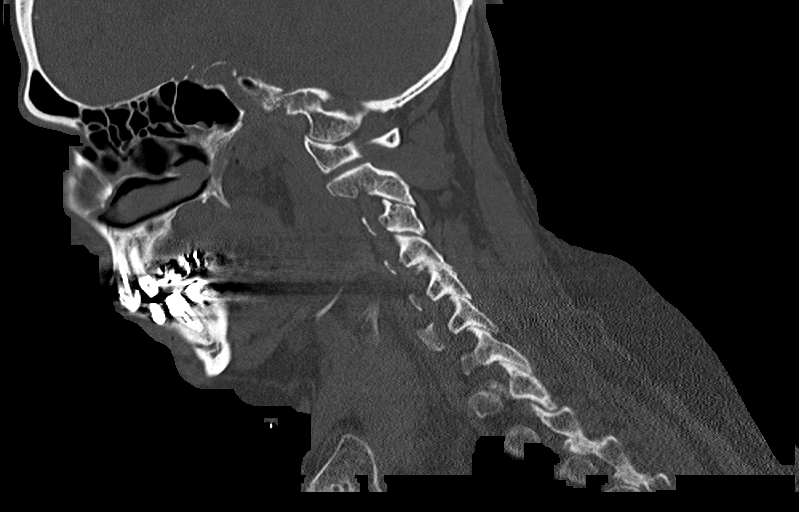

[13 of 27 positions shown; findings below may reference images not displayed]

FINDINGS: CT HEAD FINDINGS

Brain: There is no mass, hemorrhage or extra-axial collection. The
size and configuration of the ventricles and extra-axial CSF spaces
are normal. The brain parenchyma is normal, without evidence of
acute or chronic infarction.

Vascular: No abnormal hyperdensity of the major intracranial
arteries or dural venous sinuses. No intracranial atherosclerosis.

Skull: The visualized skull base, calvarium and extracranial soft
tissues are normal.

CT MAXILLOFACIAL FINDINGS

Osseous:

--Complex facial fracture types: No LeFort, zygomaticomaxillary
complex or nasoorbitoethmoidal fracture.

--Simple fracture types: None.

--Mandible: No fracture or dislocation.

Orbits: The globes are intact. Normal appearance of the intra- and
extraconal fat. Symmetric extraocular muscles and optic nerves.

Sinuses: No fluid levels or advanced mucosal thickening.

Soft tissues: Normal visualized extracranial soft tissues.

CT CERVICAL SPINE FINDINGS

Alignment: No static subluxation. Facets are aligned. Occipital
condyles and the lateral masses of C1-C2 are aligned.

Skull base and vertebrae: No acute fracture.

Soft tissues and spinal canal: No prevertebral fluid or swelling. No
visible canal hematoma.

Disc levels: No advanced spinal canal or neural foraminal stenosis.

Upper chest: No pneumothorax, pulmonary nodule or pleural effusion.

Other: Normal visualized paraspinal cervical soft tissues.
IMPRESSION: 1. No acute intracranial abnormality.
2. No facial fracture.
3. No acute fracture or static subluxation of the cervical spine.

## 2022-05-22 IMAGING — CT CT HEAD W/O CM
3 series · 15 of 47 positions shown, 18 images · non-contrast
Comparison: None.

CLINICAL DATA: Fall

EXAM:
CT HEAD WITHOUT CONTRAST
CT MAXILLOFACIAL WITHOUT CONTRAST
CT CERVICAL SPINE WITHOUT CONTRAST
TECHNIQUE: Multidetector CT imaging of the head, cervical spine, and
maxillofacial structures were performed using the standard protocol
without intravenous contrast. Multiplanar CT image reconstructions
of the cervical spine and maxillofacial structures were also
generated.

[Series 2: head wo · axial · 0.39mm/px · z∈[+342,+467]mm · 9 of 31 slices shown, 12 images]
[im 3/31  brain]
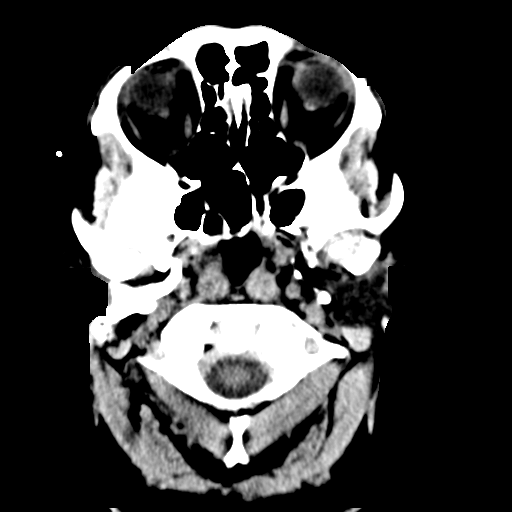
[im 3/31  bone]
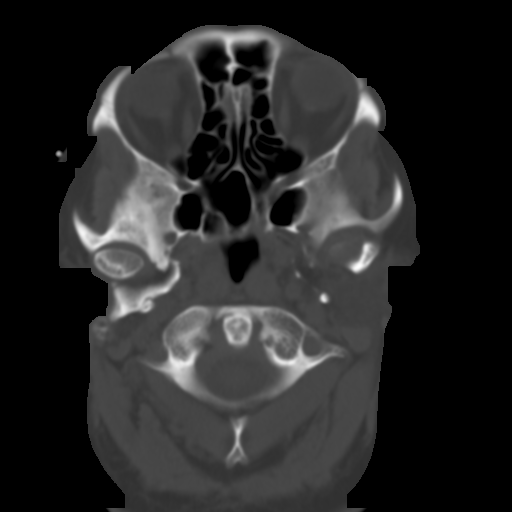
[im 6/31  brain]
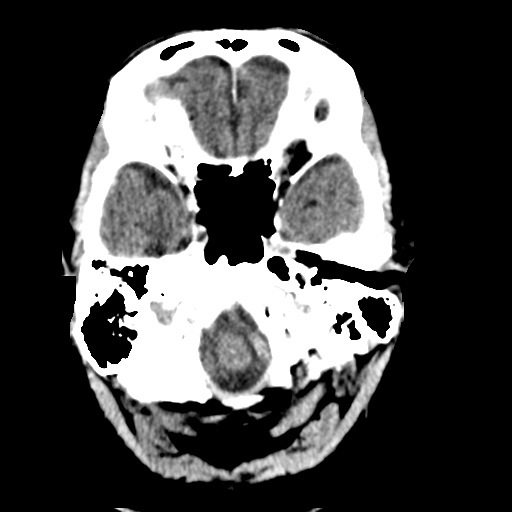
[im 9/31  brain]
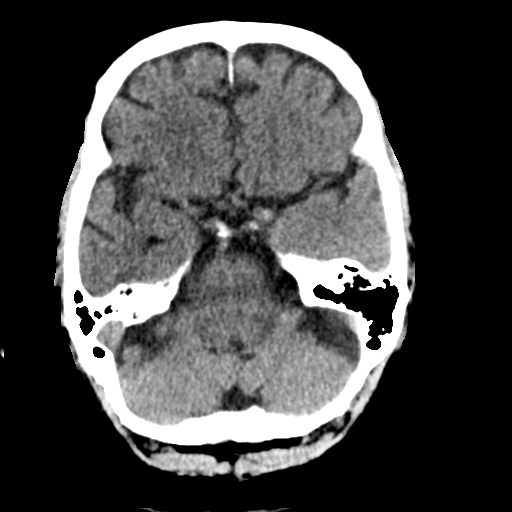
[im 12/31  brain]
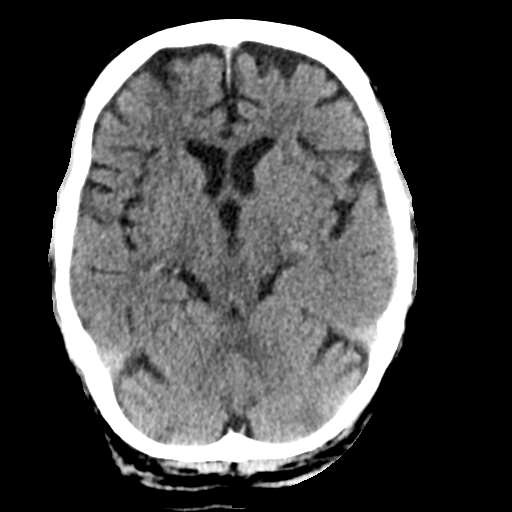
[im 16/31  brain]
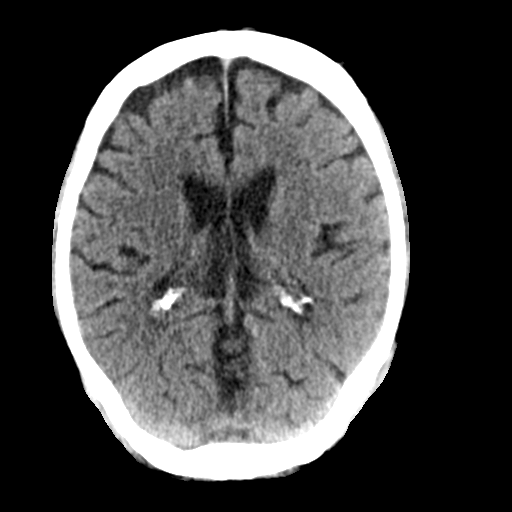
[im 16/31  bone]
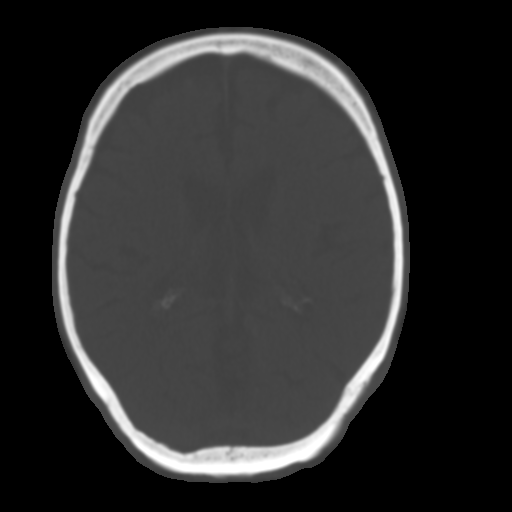
[im 19/31  brain]
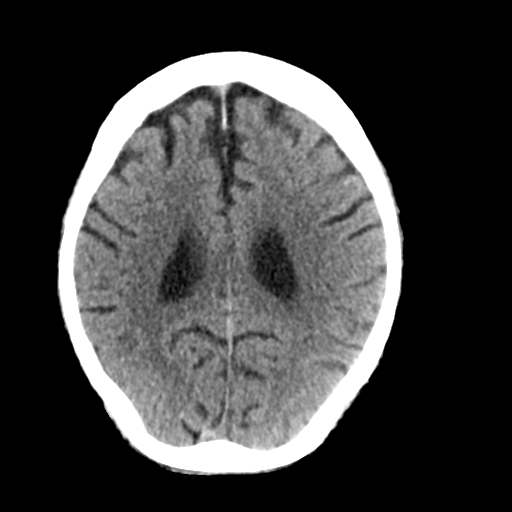
[im 22/31  brain]
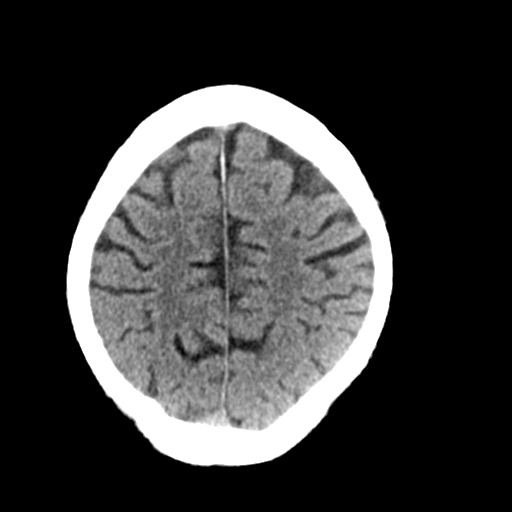
[im 25/31  brain]
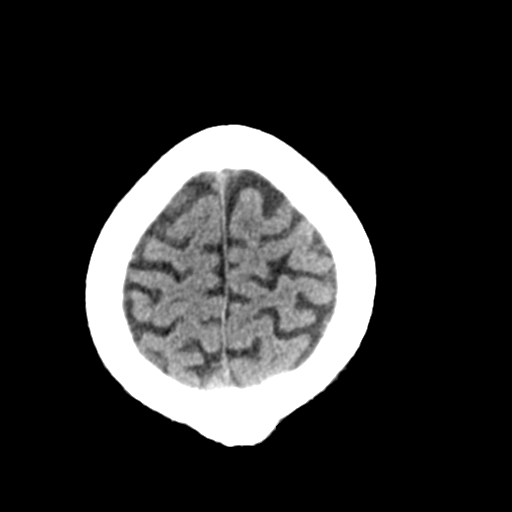
[im 28/31  brain]
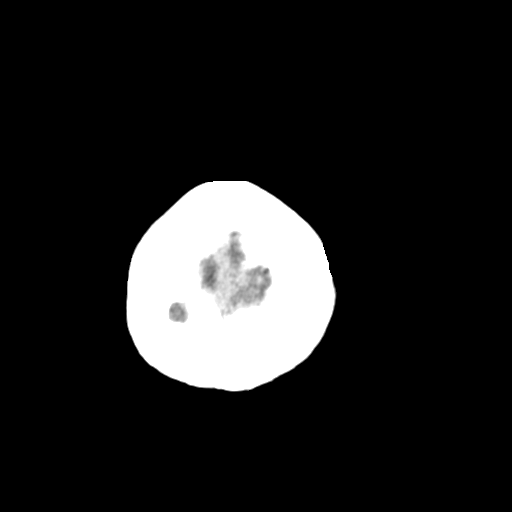
[im 28/31  bone]
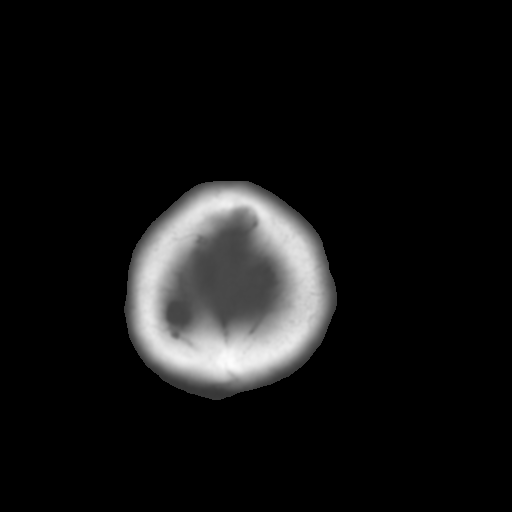

[Series 4: coronal soft tissue · coronal · 0.38mm/px · 3 of 70 slices shown]
[im 24/70  brain]
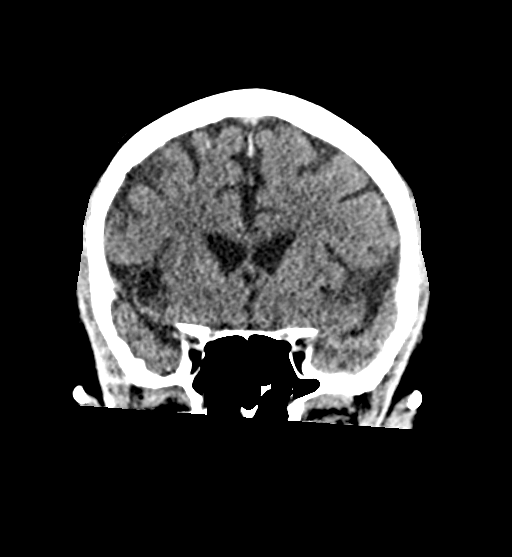
[im 31/70  brain]
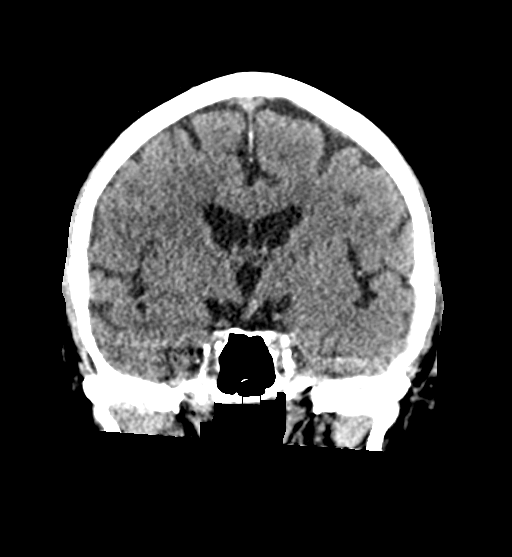
[im 39/70  brain]
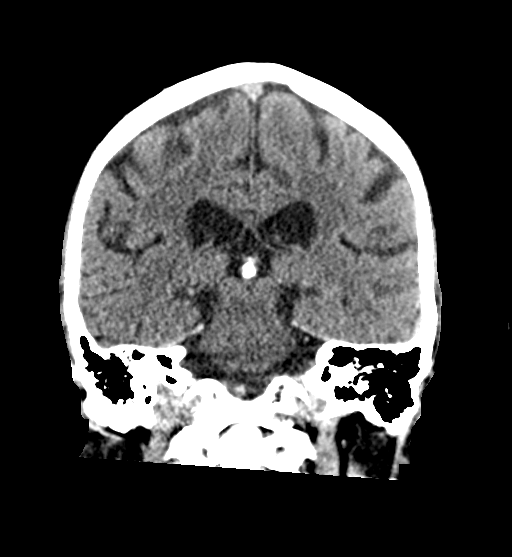

[Series 5: sagittal soft tissue · sagittal · 0.38mm/px · 3 of 55 slices shown]
[im 19/55  brain]
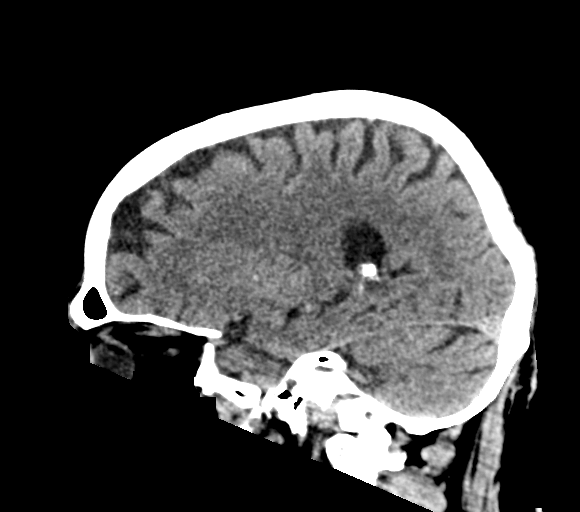
[im 28/55  brain]
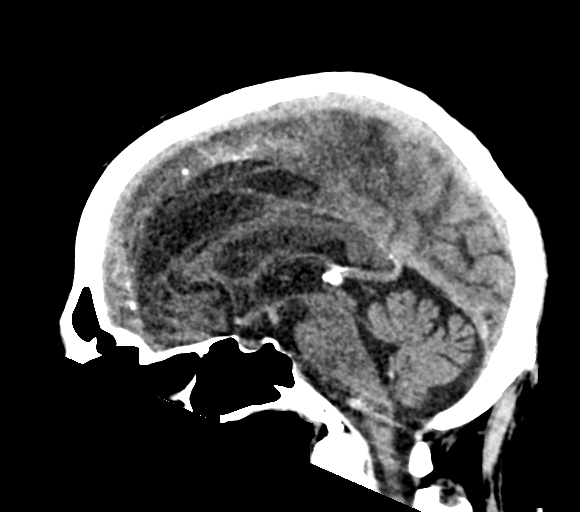
[im 37/55  brain]
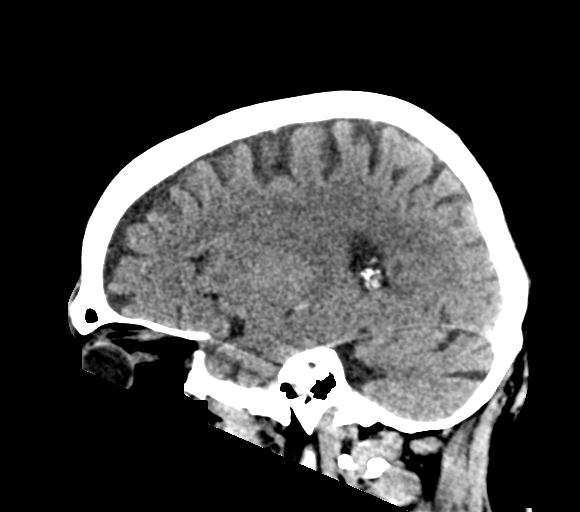

[15 of 47 positions shown; findings below may reference images not displayed]

FINDINGS: CT HEAD FINDINGS

Brain: There is no mass, hemorrhage or extra-axial collection. The
size and configuration of the ventricles and extra-axial CSF spaces
are normal. The brain parenchyma is normal, without evidence of
acute or chronic infarction.

Vascular: No abnormal hyperdensity of the major intracranial
arteries or dural venous sinuses. No intracranial atherosclerosis.

Skull: The visualized skull base, calvarium and extracranial soft
tissues are normal.

CT MAXILLOFACIAL FINDINGS

Osseous:

--Complex facial fracture types: No LeFort, zygomaticomaxillary
complex or nasoorbitoethmoidal fracture.

--Simple fracture types: None.

--Mandible: No fracture or dislocation.

Orbits: The globes are intact. Normal appearance of the intra- and
extraconal fat. Symmetric extraocular muscles and optic nerves.

Sinuses: No fluid levels or advanced mucosal thickening.

Soft tissues: Normal visualized extracranial soft tissues.

CT CERVICAL SPINE FINDINGS

Alignment: No static subluxation. Facets are aligned. Occipital
condyles and the lateral masses of C1-C2 are aligned.

Skull base and vertebrae: No acute fracture.

Soft tissues and spinal canal: No prevertebral fluid or swelling. No
visible canal hematoma.

Disc levels: No advanced spinal canal or neural foraminal stenosis.

Upper chest: No pneumothorax, pulmonary nodule or pleural effusion.

Other: Normal visualized paraspinal cervical soft tissues.
IMPRESSION: 1. No acute intracranial abnormality.
2. No facial fracture.
3. No acute fracture or static subluxation of the cervical spine.

## 2022-05-29 DIAGNOSIS — J432 Centrilobular emphysema: Secondary | ICD-10-CM | POA: Diagnosis not present

## 2022-05-29 DIAGNOSIS — Z23 Encounter for immunization: Secondary | ICD-10-CM | POA: Diagnosis not present

## 2022-05-29 DIAGNOSIS — J309 Allergic rhinitis, unspecified: Secondary | ICD-10-CM | POA: Diagnosis not present

## 2022-05-29 DIAGNOSIS — I10 Essential (primary) hypertension: Secondary | ICD-10-CM | POA: Diagnosis not present

## 2022-05-29 DIAGNOSIS — E7800 Pure hypercholesterolemia, unspecified: Secondary | ICD-10-CM | POA: Diagnosis not present

## 2022-05-29 DIAGNOSIS — H353220 Exudative age-related macular degeneration, left eye, stage unspecified: Secondary | ICD-10-CM | POA: Diagnosis not present

## 2022-05-29 DIAGNOSIS — R918 Other nonspecific abnormal finding of lung field: Secondary | ICD-10-CM | POA: Diagnosis not present

## 2022-05-29 DIAGNOSIS — E559 Vitamin D deficiency, unspecified: Secondary | ICD-10-CM | POA: Diagnosis not present

## 2022-05-29 DIAGNOSIS — N400 Enlarged prostate without lower urinary tract symptoms: Secondary | ICD-10-CM | POA: Diagnosis not present

## 2022-05-29 DIAGNOSIS — K219 Gastro-esophageal reflux disease without esophagitis: Secondary | ICD-10-CM | POA: Diagnosis not present

## 2022-05-29 DIAGNOSIS — Z09 Encounter for follow-up examination after completed treatment for conditions other than malignant neoplasm: Secondary | ICD-10-CM | POA: Diagnosis not present

## 2022-05-30 DIAGNOSIS — M79674 Pain in right toe(s): Secondary | ICD-10-CM | POA: Diagnosis not present

## 2022-05-30 DIAGNOSIS — M79675 Pain in left toe(s): Secondary | ICD-10-CM | POA: Diagnosis not present

## 2022-05-30 DIAGNOSIS — B351 Tinea unguium: Secondary | ICD-10-CM | POA: Diagnosis not present

## 2022-06-26 DIAGNOSIS — H353221 Exudative age-related macular degeneration, left eye, with active choroidal neovascularization: Secondary | ICD-10-CM | POA: Diagnosis not present

## 2022-07-09 ENCOUNTER — Other Ambulatory Visit: Payer: Self-pay | Admitting: Urology

## 2022-07-09 DIAGNOSIS — N401 Enlarged prostate with lower urinary tract symptoms: Secondary | ICD-10-CM

## 2022-07-30 DIAGNOSIS — H353221 Exudative age-related macular degeneration, left eye, with active choroidal neovascularization: Secondary | ICD-10-CM | POA: Diagnosis not present

## 2022-08-13 DIAGNOSIS — R0602 Shortness of breath: Secondary | ICD-10-CM | POA: Diagnosis not present

## 2022-08-13 DIAGNOSIS — Z87891 Personal history of nicotine dependence: Secondary | ICD-10-CM | POA: Diagnosis not present

## 2022-08-13 DIAGNOSIS — J439 Emphysema, unspecified: Secondary | ICD-10-CM | POA: Diagnosis not present

## 2022-09-03 DIAGNOSIS — Z961 Presence of intraocular lens: Secondary | ICD-10-CM | POA: Diagnosis not present

## 2022-09-03 DIAGNOSIS — H353221 Exudative age-related macular degeneration, left eye, with active choroidal neovascularization: Secondary | ICD-10-CM | POA: Diagnosis not present

## 2022-09-27 DIAGNOSIS — Z86018 Personal history of other benign neoplasm: Secondary | ICD-10-CM | POA: Diagnosis not present

## 2022-09-27 DIAGNOSIS — Z872 Personal history of diseases of the skin and subcutaneous tissue: Secondary | ICD-10-CM | POA: Diagnosis not present

## 2022-09-27 DIAGNOSIS — Z859 Personal history of malignant neoplasm, unspecified: Secondary | ICD-10-CM | POA: Diagnosis not present

## 2022-09-27 DIAGNOSIS — L570 Actinic keratosis: Secondary | ICD-10-CM | POA: Diagnosis not present

## 2022-09-27 DIAGNOSIS — L02811 Cutaneous abscess of head [any part, except face]: Secondary | ICD-10-CM | POA: Diagnosis not present

## 2022-09-27 DIAGNOSIS — L578 Other skin changes due to chronic exposure to nonionizing radiation: Secondary | ICD-10-CM | POA: Diagnosis not present

## 2022-10-15 DIAGNOSIS — H353221 Exudative age-related macular degeneration, left eye, with active choroidal neovascularization: Secondary | ICD-10-CM | POA: Diagnosis not present

## 2022-11-20 DIAGNOSIS — H353221 Exudative age-related macular degeneration, left eye, with active choroidal neovascularization: Secondary | ICD-10-CM | POA: Diagnosis not present

## 2022-11-20 DIAGNOSIS — H353211 Exudative age-related macular degeneration, right eye, with active choroidal neovascularization: Secondary | ICD-10-CM | POA: Diagnosis not present

## 2022-11-27 DIAGNOSIS — Z1329 Encounter for screening for other suspected endocrine disorder: Secondary | ICD-10-CM | POA: Diagnosis not present

## 2022-11-27 DIAGNOSIS — H353211 Exudative age-related macular degeneration, right eye, with active choroidal neovascularization: Secondary | ICD-10-CM | POA: Diagnosis not present

## 2022-11-27 DIAGNOSIS — R7303 Prediabetes: Secondary | ICD-10-CM | POA: Diagnosis not present

## 2022-11-27 DIAGNOSIS — E7800 Pure hypercholesterolemia, unspecified: Secondary | ICD-10-CM | POA: Diagnosis not present

## 2022-11-27 DIAGNOSIS — E559 Vitamin D deficiency, unspecified: Secondary | ICD-10-CM | POA: Diagnosis not present

## 2022-11-27 DIAGNOSIS — I10 Essential (primary) hypertension: Secondary | ICD-10-CM | POA: Diagnosis not present

## 2022-12-04 DIAGNOSIS — N3941 Urge incontinence: Secondary | ICD-10-CM | POA: Diagnosis not present

## 2022-12-04 DIAGNOSIS — R918 Other nonspecific abnormal finding of lung field: Secondary | ICD-10-CM | POA: Diagnosis not present

## 2022-12-04 DIAGNOSIS — M720 Palmar fascial fibromatosis [Dupuytren]: Secondary | ICD-10-CM | POA: Diagnosis not present

## 2022-12-04 DIAGNOSIS — J432 Centrilobular emphysema: Secondary | ICD-10-CM | POA: Diagnosis not present

## 2022-12-04 DIAGNOSIS — N400 Enlarged prostate without lower urinary tract symptoms: Secondary | ICD-10-CM | POA: Diagnosis not present

## 2022-12-04 DIAGNOSIS — K219 Gastro-esophageal reflux disease without esophagitis: Secondary | ICD-10-CM | POA: Diagnosis not present

## 2022-12-04 DIAGNOSIS — Z0000 Encounter for general adult medical examination without abnormal findings: Secondary | ICD-10-CM | POA: Diagnosis not present

## 2022-12-04 DIAGNOSIS — J309 Allergic rhinitis, unspecified: Secondary | ICD-10-CM | POA: Diagnosis not present

## 2022-12-04 DIAGNOSIS — E559 Vitamin D deficiency, unspecified: Secondary | ICD-10-CM | POA: Diagnosis not present

## 2022-12-04 DIAGNOSIS — E7800 Pure hypercholesterolemia, unspecified: Secondary | ICD-10-CM | POA: Diagnosis not present

## 2022-12-04 DIAGNOSIS — R809 Proteinuria, unspecified: Secondary | ICD-10-CM | POA: Diagnosis not present

## 2022-12-04 DIAGNOSIS — R7303 Prediabetes: Secondary | ICD-10-CM | POA: Diagnosis not present

## 2022-12-04 DIAGNOSIS — I10 Essential (primary) hypertension: Secondary | ICD-10-CM | POA: Diagnosis not present

## 2022-12-25 DIAGNOSIS — H353221 Exudative age-related macular degeneration, left eye, with active choroidal neovascularization: Secondary | ICD-10-CM | POA: Diagnosis not present

## 2023-01-01 DIAGNOSIS — H353211 Exudative age-related macular degeneration, right eye, with active choroidal neovascularization: Secondary | ICD-10-CM | POA: Diagnosis not present

## 2023-01-28 DIAGNOSIS — H353221 Exudative age-related macular degeneration, left eye, with active choroidal neovascularization: Secondary | ICD-10-CM | POA: Diagnosis not present

## 2023-02-05 DIAGNOSIS — H353211 Exudative age-related macular degeneration, right eye, with active choroidal neovascularization: Secondary | ICD-10-CM | POA: Diagnosis not present

## 2023-02-12 DIAGNOSIS — R0602 Shortness of breath: Secondary | ICD-10-CM | POA: Diagnosis not present

## 2023-02-12 DIAGNOSIS — J449 Chronic obstructive pulmonary disease, unspecified: Secondary | ICD-10-CM | POA: Diagnosis not present

## 2023-02-25 ENCOUNTER — Other Ambulatory Visit: Payer: Self-pay | Admitting: Urology

## 2023-02-25 DIAGNOSIS — N138 Other obstructive and reflux uropathy: Secondary | ICD-10-CM

## 2023-02-26 DIAGNOSIS — H353221 Exudative age-related macular degeneration, left eye, with active choroidal neovascularization: Secondary | ICD-10-CM | POA: Diagnosis not present

## 2023-03-12 DIAGNOSIS — H353211 Exudative age-related macular degeneration, right eye, with active choroidal neovascularization: Secondary | ICD-10-CM | POA: Diagnosis not present

## 2023-04-16 DIAGNOSIS — H353221 Exudative age-related macular degeneration, left eye, with active choroidal neovascularization: Secondary | ICD-10-CM | POA: Diagnosis not present

## 2023-04-17 DIAGNOSIS — R7303 Prediabetes: Secondary | ICD-10-CM | POA: Diagnosis not present

## 2023-04-17 DIAGNOSIS — R918 Other nonspecific abnormal finding of lung field: Secondary | ICD-10-CM | POA: Diagnosis not present

## 2023-04-17 DIAGNOSIS — K219 Gastro-esophageal reflux disease without esophagitis: Secondary | ICD-10-CM | POA: Diagnosis not present

## 2023-04-17 DIAGNOSIS — I10 Essential (primary) hypertension: Secondary | ICD-10-CM | POA: Diagnosis not present

## 2023-04-17 DIAGNOSIS — E7800 Pure hypercholesterolemia, unspecified: Secondary | ICD-10-CM | POA: Diagnosis not present

## 2023-04-17 DIAGNOSIS — R55 Syncope and collapse: Secondary | ICD-10-CM | POA: Diagnosis not present

## 2023-04-17 DIAGNOSIS — J449 Chronic obstructive pulmonary disease, unspecified: Secondary | ICD-10-CM | POA: Diagnosis not present

## 2023-04-29 ENCOUNTER — Other Ambulatory Visit: Payer: Self-pay | Admitting: Urology

## 2023-04-29 DIAGNOSIS — R32 Unspecified urinary incontinence: Secondary | ICD-10-CM

## 2023-04-29 NOTE — Progress Notes (Signed)
 05/06/2023 11:05 AM   [name] [birth year] [id number]  Referring provider: Steva Clotilda DEL, NP [address] [place],  [place] [address]  Urological history: 1. BPH with LU TS -PSA (10/2021) 0.53 -tamsulosin  0.4 mg daily and finasteride  5 mg daily  2. Urge incontinence -contributing factors of age, BPH, HTN, sleep apnea and COPD -Myrbetriq  50 mg prn  HPI: Arthur Owens is a 82 y.o. male who presents today for one year for follow up.   Previous records reviewed.    Up to date w/ specialists (pulmonology, cardiology) and PCP visits.    I PSS 5/1  PVR 2 mL   UA benign  He typically is awake at 3 AM with the urge to urinate and then he is awakened again at 4 AM and then at 5 AM and then at 6 AM and then at 7 AM, but he is able to go right back to sleep.  After the 6 or 7 AM void, he typically will not experience any urinary urgency or urge incontinence for the rest of the day.  Patient denies any modifying or aggravating factors.  Patient denies any recent UTI's, gross hematuria, dysuria or suprapubic/flank pain.  Patient denies any fevers, chills, nausea or vomiting.    He did find the Myrbetriq  somewhat helpful, but it is cost prohibitive.   IPSS     Row Name 05/06/23 1000         International Prostate Symptom Score   How often have you had the sensation of not emptying your bladder? Not at All     How often have you had to urinate less than every two hours? Less than 1 in 5 times     How often have you found you stopped and started again several times when you urinated? Not at All     How often have you found it difficult to postpone urination? Not at All     How often have you had a weak urinary stream? Less than 1 in 5 times     How often have you had to strain to start urination? Not at All     How many times did you typically get up at night to urinate? 3 Times     Total IPSS Score 5       Quality of Life due to urinary symptoms   If you were to spend  the rest of your life with your urinary condition just the way it is now how would you feel about that? Pleased                  Score:  1-7 Mild 8-19 Moderate 20-35 Severe   PMH: Past Medical History:  Diagnosis Date   ADD (attention deficit disorder)    Anxiety    Bladder neck obstruction    Cancer (HCC)    H/O ADENOMATOUS POLYP OF COLON   COPD (chronic obstructive pulmonary disease) (HCC)    GERD (gastroesophageal reflux disease)    HTN (hypertension)    Hyperlipidemia    Seasonal allergies    Shortness of breath dyspnea     Surgical History: Past Surgical History:  Procedure Laterality Date   adentamous colon polyp     APPENDECTOMY     CATARACT EXTRACTION W/PHACO Left 01/08/2022   Procedure: CATARACT EXTRACTION PHACO AND INTRAOCULAR LENS PLACEMENT (IOC) LEFT 11.09 00:57.6;  Surgeon: Myrna Adine Anes, MD;  Location: [facility];  Service: Ophthalmology;  Laterality: Left;   CATARACT  EXTRACTION W/PHACO Right 01/22/2022   Procedure: CATARACT EXTRACTION PHACO AND INTRAOCULAR LENS PLACEMENT (IOC) RIGHT 13.52 01:05.6;  Surgeon: Myrna Adine Anes, MD;  Location: [facility];  Service: Ophthalmology;  Laterality: Right;   COLONOSCOPY WITH PROPOFOL  N/A 01/28/2015   Procedure: COLONOSCOPY WITH PROPOFOL ;  Surgeon: Lamar ONEIDA Holmes, MD;  Location: [facility] END[facility]COPY;  Service: Endoscopy;  Laterality: N/A;   COLONOSCOPY WITH PROPOFOL  N/A 09/23/2020   Procedure: COLONOSCOPY WITH PROPOFOL ;  Surgeon: Maryruth Ole ONEIDA, MD;  Location: [facility] ENDOSCOPY;  Service: Endoscopy;  Laterality: N/A;   ESOPHAGOGASTRODUODENOSCOPY (EGD) WITH PROPOFOL  N/A 01/28/2015   Procedure: ESOPHAGOGASTRODUODENOSCOPY (EGD) WITH PROPOFOL ;  Surgeon: Lamar ONEIDA Holmes, MD;  Location: [facility] ENDOSCOPY;  Service: Endoscopy;  Laterality: N/A;   HERNIA REPAIR Left    LEFT INGUINAL   left humerus debridement Left    TONSILLECTOMY     TRIGGER FINGER RELEASE Left 04/15/2015   Procedure: LEFT RING FINGER  DUPUYTREN RELEASE;  Surgeon: Helayne Glenn, MD;  Location: [facility];  Service: Orthopedics;  Laterality: Left;    Home Medications:  Allergies as of 05/06/2023       Reactions   Hydrocodone  Bit-homatrop Mbr Nausea And Vomiting        Medication List        Accurate as of May 06, 2023 11:05 AM. If you have any questions, ask your nurse or doctor.          STOP taking these medications    fluticasone 50 MCG/ACT nasal spray Commonly known as: FLONASE   ipratropium 0.03 % nasal spray Commonly known as: ATROVENT   mirabegron  ER 50 MG Tb24 tablet Commonly known as: Myrbetriq    tiotropium 18 MCG inhalation capsule Commonly known as: SPIRIVA       TAKE these medications    albuterol  108 (90 Base) MCG/ACT inhaler Commonly known as: VENTOLIN  HFA Inhale 2 puffs into the lungs every 6 (six) hours as needed for wheezing or shortness of breath.   atorvastatin 40 MG tablet Commonly known as: LIPITOR Take 40 mg by mouth daily. PM   azelastine 0.1 % nasal spray Commonly known as: ASTELIN Place 1 spray into both nostrils 2 (two) times daily.   cetirizine 10 MG tablet Commonly known as: ZYRTEC Take 10 mg by mouth daily. AM   cyanocobalamin 1000 MCG tablet Commonly known as: VITAMIN B12 Take 1,000 mcg by mouth daily.   famotidine 20 MG tablet Commonly known as: PEPCID Take 20 mg by mouth daily.   finasteride  5 MG tablet Commonly known as: PROSCAR  Take 1 tablet (5 mg total) by mouth daily. Appt needed for further refills   hydrochlorothiazide 12.5 MG tablet Commonly known as: HYDRODIURIL Take 12.5 mg by mouth daily. AM   losartan 25 MG tablet Commonly known as: COZAAR Take 1 tablet by mouth daily.   tamsulosin  0.4 MG Caps capsule Commonly known as: FLOMAX  Take 1 capsule (0.4 mg total) by mouth daily. EVE   Vitamin D3 50 MCG (2000 UT) capsule Take 2,000 Units by mouth daily.        Allergies:  Allergies  Allergen Reactions    Hydrocodone  Bit-Homatrop Mbr Nausea And Vomiting    Family History: Family History  Problem Relation Age of Onset   Colon cancer Father    Prostate cancer Father    Stroke Mother    Diabetes Other    Hyperlipidemia Other    Glaucoma Other    Kidney disease Neg Hx    Kidney cancer Neg Hx  Bladder Cancer Neg Hx     Social History:  reports that he has quit smoking. His smoking use included cigarettes. He has a 30 pack-year smoking history. He has never used smokeless tobacco. He reports current alcohol  use of about 2.0 standard drinks of alcohol  per week. He reports that he does not use drugs.  ROS: For pertinent review of systems please refer to history of present illness  Physical Exam: BP 126/73 (BP Location: Right Arm, Patient Position: Sitting, Cuff Size: Large)   Pulse 75   Ht 5' 8 (1.727 m)   Wt 194 lb (88 kg)   BMI 29.50 kg/m   Constitutional:  Well nourished. Alert and oriented, No acute distress. HEENT: [place] AT, moist mucus membranes.  Trachea midline Cardiovascular: No clubbing, cyanosis, or edema. Respiratory: Normal respiratory effort, no increased work of breathing. Neurologic: Grossly intact, no focal deficits, moving all 4 extremities. Psychiatric: Normal mood and affect.   Laboratory Data: tains abnormal data CBC w/auto Differential (3 Part) Order: [id number] Component Ref Range & Units 5 mo ago  WBC (White Blood Cell Count) 4.1 - 10.2 10^3/uL 5.4  RBC (Red Blood Cell Count) 4.69 - 6.13 10^6/uL 4.42 Low   Hemoglobin 14.1 - 18.1 gm/dL 85.8  Hematocrit 59.9 - 52.0 % 42.6  MCV (Mean Corpuscular Volume) 80.0 - 100.0 fl 96.4  MCH (Mean Corpuscular Hemoglobin) 27.0 - 31.2 pg 31.9 High   MCHC (Mean Corpuscular Hemoglobin Concentration) 32.0 - 36.0 gm/dL 66.8  Platelet Count 849 - 450 10^3/uL 222  RDW-CV (Red Cell Distribution Width) 11.6 - 14.8 % 13.4  MPV (Mean Platelet Volume) 9.4 - 12.4 fl 8.6 Low   Neutrophils 1.50 - 7.80 10^3/uL 3.5   Lymphocytes 1.00 - 3.60 10^3/uL 1.4  Mixed Count 0.10 - 0.90 10^3/uL 0.5  Neutrophil % 32.0 - 70.0 % 64.1  Lymphocyte % 10.0 - 50.0 % 26.1  Mixed % 3.0 - 14.4 % 9.8  Resulting Agency [facility]   Specimen Collected: 11/27/22 08:04   Performed by: [facility] Last Resulted: 11/27/22 09:25  Received From: [facility]  Result Received: 02/11/23 16:08   Comprehensive Metabolic Panel (CMP) Order: [id number] Component Ref Range & Units 5 mo ago  Glucose 70 - 110 mg/dL 99  Sodium 863 - 854 mmol/L 142  Potassium 3.6 - 5.1 mmol/L 4.2  Chloride 97 - 109 mmol/L 107  Carbon Dioxide (CO2) 22.0 - 32.0 mmol/L 28.7  Urea Nitrogen (BUN) 7 - 25 mg/dL 19  Creatinine 0.7 - 1.3 mg/dL 1.1  Glomerular Filtration Rate (eGFR) >60 mL/min/1.73sq m 67  Comment: CKD-EPI (2021) does not include patient's race in the calculation of eGFR.  Monitoring changes of plasma creatinine and eGFR over time is useful for monitoring kidney function.  Interpretive Ranges for eGFR (CKD-EPI 2021):  eGFR:       >60 mL/min/1.73 sq. m - Normal eGFR:       30-59 mL/min/1.73 sq. m - Moderately Decreased eGFR:       15-29 mL/min/1.73 sq. m  - Severely Decreased eGFR:       < 15 mL/min/1.73 sq. m  - Kidney Failure   Note: These eGFR calculations do not apply in acute situations when eGFR is changing rapidly or patients on dialysis.  Calcium 8.7 - 10.3 mg/dL 9  AST 8 - 39 U/L 19  ALT 6 - 57 U/L 31  Alk Phos (alkaline Phosphatase) 34 - 104 U/L 40  Albumin 3.5 -  4.8 g/dL 4  Bilirubin, Total 0.3 - 1.2 mg/dL 0.5  Protein, Total 6.1 - 7.9 g/dL 6.5  A/G Ratio 1.0 - 5.0 gm/dL 1.6  Resulting Agency [facility]   Specimen Collected: 11/27/22 08:04   Performed by: [facility] Last Resulted: 11/27/22 13:31  Received From: [facility]  Result Received: 02/11/23 16:08   Hemoglobin A1C Order:  [id number] Component Ref Range & Units 5 mo ago  Hemoglobin A1C 4.2 - 5.6 % 6 High   Average Blood Glucose (Calc) mg/dL 873  Resulting Agency [facility]  Narrative Performed by [facility] Normal Range:    4.2 - 5.6% Increased Risk:  5.7 - 6.4% Diabetes:        >= 6.5% Glycemic Control for adults with diabetes:  <7%    Specimen Collected: 11/27/22 08:04   Performed by: [facility] Last Resulted: 11/27/22 13:57  Received From: [facility]  Result Received: 02/11/23 16:08    Lipid Panel w/calc LDL Order: [id number] Component Ref Range & Units 5 mo ago  Cholesterol, Total 100 - 200 mg/dL 841  Triglyceride 35 - 199 mg/dL 899  HDL (High Density Lipoprotein) Cholesterol 29.0 - 71.0 mg/dL 57.7  LDL Calculated 0 - 130 mg/dL 96  VLDL Cholesterol mg/dL 20  Cholesterol/HDL Ratio 3.7  Resulting Agency [facility]   Specimen Collected: 11/27/22 08:04   Performed by: [facility] Last Resulted: 11/27/22 13:31  Received From: [facility]  Result Received: 02/11/23 16:08   Thyroid  Stimulating-Hormone (TSH) Order: [id number] Component Ref Range & Units 5 mo ago  Thyroid  Stimulating Hormone (TSH) 0.450-5.330 uIU/ml uIU/mL 4.776  Resulting Agency [facility]   Specimen Collected: 11/27/22 08:04   Performed by: [facility] Last Resulted: 11/27/22 13:31  Received From: [facility]  Result Received: 02/11/23 16:08   Urinalysis See EPIC and HPI I have reviewed the labs.  See HPI.      Pertinent Imaging:  05/06/23 10:25  Scan Result 2ml     Assessment & Plan:    1. Urge Incontinence -PVR demonstrates adequate emptying  -We discussed other OAB agents in the anticholinergic family as they are available in generic, but after discussion regarding the possible development of dementia/Alzheimer while on that medication, he  deferred -We also discussed PTNS, but he deferred  2. BPH with LU TS -continue tamsulosin  0.4 mg daily and finasteride  5 mg daily   3. Mild dilation of ascending aorta -CT (2022) - Stable mild dilatation of the ascending aorta. Recommend annual imaging followup by CTA or MRA. -advised him to speak with his PCP or cardiologist regarding follow up imaging  Return in about 1 year (around 05/05/2024) for I PSS .  CLOTILDA HELON RIGGERS  [facility] [address], [address] [place], [place] [address] [phone]

## 2023-04-30 DIAGNOSIS — H353211 Exudative age-related macular degeneration, right eye, with active choroidal neovascularization: Secondary | ICD-10-CM | POA: Diagnosis not present

## 2023-05-06 ENCOUNTER — Ambulatory Visit: Payer: PPO | Admitting: Urology

## 2023-05-06 ENCOUNTER — Encounter: Payer: Self-pay | Admitting: Urology

## 2023-05-06 ENCOUNTER — Other Ambulatory Visit
Admission: RE | Admit: 2023-05-06 | Discharge: 2023-05-06 | Disposition: A | Discharge status: Home / Self Care | Payer: PPO | Attending: Urology | Admitting: Urology

## 2023-05-06 VITALS — BP 126/73 | HR 75 | Ht 68.0 in | Wt 194.0 lb

## 2023-05-06 DIAGNOSIS — R32 Unspecified urinary incontinence: Secondary | ICD-10-CM | POA: Diagnosis not present

## 2023-05-06 DIAGNOSIS — N3941 Urge incontinence: Secondary | ICD-10-CM

## 2023-05-06 DIAGNOSIS — N401 Enlarged prostate with lower urinary tract symptoms: Secondary | ICD-10-CM | POA: Diagnosis not present

## 2023-05-06 LAB — URINALYSIS, COMPLETE (UACMP) WITH MICROSCOPIC
Bacteria, UA: NONE SEEN
Bilirubin Urine: NEGATIVE
Glucose, UA: NEGATIVE mg/dL
Hgb urine dipstick: NEGATIVE
Ketones, ur: NEGATIVE mg/dL
Leukocytes,Ua: NEGATIVE
Nitrite: NEGATIVE
Protein, ur: NEGATIVE mg/dL
Specific Gravity, Urine: 1.015 (ref 1.005–1.030)
pH: 6.5 (ref 5.0–8.0)

## 2023-05-06 LAB — BLADDER SCAN AMB NON-IMAGING

## 2023-05-06 MED ORDER — FINASTERIDE 5 MG PO TABS: 5.0000 mg | ORAL_TABLET | Freq: Every day | ORAL | 3 refills | Status: AC

## 2023-05-06 MED ORDER — TAMSULOSIN HCL 0.4 MG PO CAPS: 0.4000 mg | ORAL_CAPSULE | Freq: Every day | ORAL | 3 refills | Status: AC

## 2023-05-17 DIAGNOSIS — Z961 Presence of intraocular lens: Secondary | ICD-10-CM | POA: Diagnosis not present

## 2023-05-17 DIAGNOSIS — H353221 Exudative age-related macular degeneration, left eye, with active choroidal neovascularization: Secondary | ICD-10-CM | POA: Diagnosis not present

## 2023-05-31 DIAGNOSIS — I10 Essential (primary) hypertension: Secondary | ICD-10-CM | POA: Diagnosis not present

## 2023-06-17 DIAGNOSIS — H353211 Exudative age-related macular degeneration, right eye, with active choroidal neovascularization: Secondary | ICD-10-CM | POA: Diagnosis not present

## 2023-06-25 DIAGNOSIS — H353211 Exudative age-related macular degeneration, right eye, with active choroidal neovascularization: Secondary | ICD-10-CM | POA: Diagnosis not present

## 2023-07-02 DIAGNOSIS — J432 Centrilobular emphysema: Secondary | ICD-10-CM | POA: Diagnosis not present

## 2023-07-02 DIAGNOSIS — H353220 Exudative age-related macular degeneration, left eye, stage unspecified: Secondary | ICD-10-CM | POA: Diagnosis not present

## 2023-07-02 DIAGNOSIS — I10 Essential (primary) hypertension: Secondary | ICD-10-CM | POA: Diagnosis not present

## 2023-07-02 DIAGNOSIS — E559 Vitamin D deficiency, unspecified: Secondary | ICD-10-CM | POA: Diagnosis not present

## 2023-07-02 DIAGNOSIS — E7800 Pure hypercholesterolemia, unspecified: Secondary | ICD-10-CM | POA: Diagnosis not present

## 2023-07-02 DIAGNOSIS — K219 Gastro-esophageal reflux disease without esophagitis: Secondary | ICD-10-CM | POA: Diagnosis not present

## 2023-07-02 DIAGNOSIS — Z09 Encounter for follow-up examination after completed treatment for conditions other than malignant neoplasm: Secondary | ICD-10-CM | POA: Diagnosis not present

## 2023-07-02 DIAGNOSIS — J309 Allergic rhinitis, unspecified: Secondary | ICD-10-CM | POA: Diagnosis not present

## 2023-08-05 DIAGNOSIS — H353221 Exudative age-related macular degeneration, left eye, with active choroidal neovascularization: Secondary | ICD-10-CM | POA: Diagnosis not present

## 2023-08-27 DIAGNOSIS — H353211 Exudative age-related macular degeneration, right eye, with active choroidal neovascularization: Secondary | ICD-10-CM | POA: Diagnosis not present

## 2023-09-09 DIAGNOSIS — J301 Allergic rhinitis due to pollen: Secondary | ICD-10-CM | POA: Diagnosis not present

## 2023-09-09 DIAGNOSIS — R0602 Shortness of breath: Secondary | ICD-10-CM | POA: Diagnosis not present

## 2023-09-09 DIAGNOSIS — J439 Emphysema, unspecified: Secondary | ICD-10-CM | POA: Diagnosis not present

## 2023-09-24 DIAGNOSIS — H353221 Exudative age-related macular degeneration, left eye, with active choroidal neovascularization: Secondary | ICD-10-CM | POA: Diagnosis not present

## 2023-09-26 DIAGNOSIS — L570 Actinic keratosis: Secondary | ICD-10-CM | POA: Diagnosis not present

## 2023-09-26 DIAGNOSIS — L578 Other skin changes due to chronic exposure to nonionizing radiation: Secondary | ICD-10-CM | POA: Diagnosis not present

## 2023-09-26 DIAGNOSIS — Z872 Personal history of diseases of the skin and subcutaneous tissue: Secondary | ICD-10-CM | POA: Diagnosis not present

## 2023-09-26 DIAGNOSIS — Z859 Personal history of malignant neoplasm, unspecified: Secondary | ICD-10-CM | POA: Diagnosis not present

## 2023-09-26 DIAGNOSIS — Z86018 Personal history of other benign neoplasm: Secondary | ICD-10-CM | POA: Diagnosis not present

## 2023-11-04 DIAGNOSIS — M79674 Pain in right toe(s): Secondary | ICD-10-CM | POA: Diagnosis not present

## 2023-11-04 DIAGNOSIS — B351 Tinea unguium: Secondary | ICD-10-CM | POA: Diagnosis not present

## 2023-11-04 DIAGNOSIS — M79675 Pain in left toe(s): Secondary | ICD-10-CM | POA: Diagnosis not present

## 2023-11-05 DIAGNOSIS — H353211 Exudative age-related macular degeneration, right eye, with active choroidal neovascularization: Secondary | ICD-10-CM | POA: Diagnosis not present

## 2023-11-19 DIAGNOSIS — H353221 Exudative age-related macular degeneration, left eye, with active choroidal neovascularization: Secondary | ICD-10-CM | POA: Diagnosis not present

## 2023-12-27 DIAGNOSIS — I10 Essential (primary) hypertension: Secondary | ICD-10-CM | POA: Diagnosis not present

## 2023-12-27 DIAGNOSIS — E559 Vitamin D deficiency, unspecified: Secondary | ICD-10-CM | POA: Diagnosis not present

## 2023-12-27 DIAGNOSIS — R7303 Prediabetes: Secondary | ICD-10-CM | POA: Diagnosis not present

## 2023-12-27 DIAGNOSIS — Z1329 Encounter for screening for other suspected endocrine disorder: Secondary | ICD-10-CM | POA: Diagnosis not present

## 2023-12-27 DIAGNOSIS — E7800 Pure hypercholesterolemia, unspecified: Secondary | ICD-10-CM | POA: Diagnosis not present

## 2024-01-03 DIAGNOSIS — N3941 Urge incontinence: Secondary | ICD-10-CM | POA: Diagnosis not present

## 2024-01-03 DIAGNOSIS — N400 Enlarged prostate without lower urinary tract symptoms: Secondary | ICD-10-CM | POA: Diagnosis not present

## 2024-01-03 DIAGNOSIS — E559 Vitamin D deficiency, unspecified: Secondary | ICD-10-CM | POA: Diagnosis not present

## 2024-01-03 DIAGNOSIS — K219 Gastro-esophageal reflux disease without esophagitis: Secondary | ICD-10-CM | POA: Diagnosis not present

## 2024-01-03 DIAGNOSIS — Z79899 Other long term (current) drug therapy: Secondary | ICD-10-CM | POA: Diagnosis not present

## 2024-01-03 DIAGNOSIS — M25561 Pain in right knee: Secondary | ICD-10-CM | POA: Diagnosis not present

## 2024-01-03 DIAGNOSIS — E7800 Pure hypercholesterolemia, unspecified: Secondary | ICD-10-CM | POA: Diagnosis not present

## 2024-01-03 DIAGNOSIS — J309 Allergic rhinitis, unspecified: Secondary | ICD-10-CM | POA: Diagnosis not present

## 2024-01-03 DIAGNOSIS — Z0000 Encounter for general adult medical examination without abnormal findings: Secondary | ICD-10-CM | POA: Diagnosis not present

## 2024-01-03 DIAGNOSIS — G8929 Other chronic pain: Secondary | ICD-10-CM | POA: Diagnosis not present

## 2024-01-03 DIAGNOSIS — I10 Essential (primary) hypertension: Secondary | ICD-10-CM | POA: Diagnosis not present

## 2024-01-03 DIAGNOSIS — R918 Other nonspecific abnormal finding of lung field: Secondary | ICD-10-CM | POA: Diagnosis not present

## 2024-01-03 DIAGNOSIS — J432 Centrilobular emphysema: Secondary | ICD-10-CM | POA: Diagnosis not present

## 2024-01-03 DIAGNOSIS — R7303 Prediabetes: Secondary | ICD-10-CM | POA: Diagnosis not present

## 2024-01-03 DIAGNOSIS — R809 Proteinuria, unspecified: Secondary | ICD-10-CM | POA: Diagnosis not present

## 2024-01-28 DIAGNOSIS — H353221 Exudative age-related macular degeneration, left eye, with active choroidal neovascularization: Secondary | ICD-10-CM | POA: Diagnosis not present

## 2024-02-03 DIAGNOSIS — M898X6 Other specified disorders of bone, lower leg: Secondary | ICD-10-CM | POA: Diagnosis not present

## 2024-02-03 DIAGNOSIS — M720 Palmar fascial fibromatosis [Dupuytren]: Secondary | ICD-10-CM | POA: Diagnosis not present

## 2024-02-04 DIAGNOSIS — H353211 Exudative age-related macular degeneration, right eye, with active choroidal neovascularization: Secondary | ICD-10-CM | POA: Diagnosis not present

## 2024-02-05 NOTE — H&P (View-Only) (Signed)
 ORTHOPAEDIC SURGERY- CLINIC NOTE  Chief Complaint: Right hand contracture  History of Present Illness: History of Present Illness FRED Gernert is an 83 year old male with Dupuytren's contracture who presents for evaluation and management of hand contractures.  He has a history of Dupuytren's contracture affecting his hands, with a prior needle procedure performed over twenty years ago to the right hand small finger, which provided relief for a significant period. The contracture began to recur approximately five years ago and has progressively worsened, particularly affecting his ring finger.  He also underwent a palmar fasciotomy on his left hand of his ring finger for Dupuytren's.  He is concerned about the sensitivity to needles but is open to undergoing another needle procedure for treatment. He inquires about the possibility of having the procedure done closer to his residence in [place], indicating a preference for convenience.  Prior medical records were reviewed.  The patient was previously seen by Krystal Doyne, PA.SABRA   PMHx, PSurgHx, Fam Hx, Soc Hx, Meds, Allergies: Past Medical History:  Diagnosis Date  . ADD (attention deficit disorder)   . Allergic rhinitis   . Allergic state   . BPH with obstruction/lower urinary tract symptoms    Follow[facility] by [facility]  . Cataract cortical, senile    left eye  . COPD (chronic obstructive pulmonary disease) (CMS/HHS-HCC)    Stage I.  Followed by Dr. Theotis.  SABRA GERD (gastroesophageal reflux disease)   . H/O adenomatous polyp of colon 12/14/2014  . H/O adenomatous polyp of colon 12/14/2014  . Hyperlipidemia   . Hypertension   . Pulmonary nodule, left 01/13/2014   Followup CT scan 12/2014   . Rotator cuff tendinitis, right 03/16/2020  . Sinusitis, unspecified   . Syncope and collapse 08/19/2020    Past Surgical History:  Procedure Laterality Date  . COLONOSCOPY  10/16/2011   Sessile Serrated Adenoma, [facility] (Father): CBF  09/2014; Recall Ltr mailed 08/04/2014 (dw): OV made 12/14/2014 @ 10:30am w/Kim Moishe NP (dw)  . COLONOSCOPY  01/28/2015   PH Adenomatous Polyp, [facility] (Father): CBF 12/2019  . EGD  01/28/2015   No repeat per RTE  . dupuytren release Left 04/15/2015   ring finger  . COLONOSCOPY  09/23/2020   Normal colon biopsy/PHx CP/No repeat due to age/CTL  . APPENDECTOMY    . COLONOSCOPY  06/07/2006, 06/06/2001   [facility] (Father)  . Debridement of left humerus     for osteomyelitis  . HERNIA REPAIR Left    Inguinal  . TONSILLECTOMY      Family History  Problem Relation Age of Onset  . Colon cancer Father   . Colon cancer Mother   . Coronary Artery Disease (Blocked arteries around heart) Mother   . Diabetes type II Mother   . High blood pressure (Hypertension) Mother   . Stroke Mother   . Deep vein thrombosis (DVT or abnormal blood clot formation) Mother     Social History   Socioeconomic History  . Marital status: Married    Spouse name: Recardo  . Number of children: 0  Occupational History  . Occupation: Retired  Tobacco Use  . Smoking status: Former    Current packs/day: 0.00    Average packs/day: 2.0 packs/day for 45.5 years (91.1 ttl pk-yrs)    Types: Cigarettes    Start date: 05/14/1968    Quit date: 11/30/2013    Years since quitting: 10.1  . Smokeless tobacco: Never  Vaping Use  . Vaping status: Never Used  Substance  and Sexual Activity  . Alcohol  use: Not Currently    Alcohol /week: 1.0 standard drink of alcohol     Types: 1 Glasses of wine per week    Comment: occasional   . Drug use: No  . Sexual activity: Not Currently    Partners: Female    Birth control/protection: Abstinence   Social Drivers of Health   Financial Resource Strain: Low Risk  (02/06/2024)   Overall Financial Resource Strain (CARDIA)   . Difficulty of Paying Living Expenses: Not very hard  Recent Concern: Financial Resource Strain - Medium Risk (01/03/2024)   Overall Financial Resource Strain (CARDIA)   .  Difficulty of Paying Living Expenses: Somewhat hard  Food Insecurity: No Food Insecurity (02/06/2024)   Hunger Vital Sign   . Worried About Programme Researcher, Broadcasting/film/video in the Last Year: Never true   . Ran Out of Food in the Last Year: Never true  Transportation Needs: No Transportation Needs (02/06/2024)   PRAPARE - Transportation   . Lack of Transportation (Medical): No   . Lack of Transportation (Non-Medical): No  Physical Activity: Insufficiently Active (01/03/2024)   Exercise Vital Sign   . Days of Exercise per Week: 1 day   . Minutes of Exercise per Session: 30 min  Stress: No Stress Concern Present (01/03/2024)   [employer] of Occupational Health - Occupational Stress Questionnaire   . Feeling of Stress : Not at all  Social Connections: Socially Integrated (01/03/2024)   Social Connection and Isolation Panel   . Frequency of Communication with Friends and Family: Twice a week   . Frequency of Social Gatherings with Friends and Family: More than three times a week   . Attends Religious Services: More than 4 times per year   . Active Member of Clubs or Organizations: Yes   . Attends Banker Meetings: More than 4 times per year   . Marital Status: Married  Housing Stability: Low Risk  (02/06/2024)   Housing Stability Vital Sign   . Unable to Pay for Housing in the Last Year: No   . Number of Times Moved in the Last Year: 0   . Homeless in the Last Year: No     Current Outpatient Medications  Medication Sig Dispense Refill  . albuterol  90 mcg/actuation inhaler Inhale 2 inhalations into the lungs every 6 (six) hours as needed for Wheezing 1 each 5  . atorvastatin (LIPITOR) 40 MG tablet TAKE 1 TABLET (40 MG TOTAL) BY MOUTH ONCE DAILY FOR CHOLESTEROL 90 tablet 1  . cetirizine (ZYRTEC) 10 mg capsule Take 10 mg by mouth once daily    . cholecalciferol (VITAMIN D3) 2,000 unit capsule Take 1 capsule (2,000 Units total) by mouth once daily 360 capsule 11  . cyanocobalamin (VITAMIN  B12) 1000 MCG tablet Take 1,000 mcg by mouth once daily.    . famotidine (PEPCID) 20 MG tablet TAKE 1 TABLET BY MOUTH 2 TIMES DAILY AS NEEDED FOR HEARTBURN. 180 tablet 1  . finasteride  (PROSCAR ) 5 mg tablet Take 5 mg by mouth once daily.    . hydroCHLOROthiazide (HYDRODIURIL) 12.5 MG tablet TAKE 1 TABLET BY MOUTH EVERY DAY 90 tablet 1  . losartan (COZAAR) 25 MG tablet TAKE 1 TABLET BY MOUTH EVERY DAY 90 tablet 1  . meloxicam (MOBIC) 15 MG tablet Take 1 tablet (15 mg total) by mouth once daily for 30 days 30 tablet 1  . tamsulosin  (FLOMAX ) 0.4 mg capsule Take 2 capsules (0.8 mg total) by mouth once daily  Take 30 minutes after same meal each day. 180 capsule 3  . tiotropium (SPIRIVA WITH HANDIHALER) 18 mcg inhalation capsule Place 1 capsule (18 mcg total) into inhaler and inhale once daily 90 capsule 3  . vitamin E 400 UNIT capsule Take 400 Units by mouth once daily     No current facility-administered medications for this visit.    Allergies  Allergen Reactions  . Hycodan (With Homatropin) [Hydrocodone -Homatropine] Nausea And Vomiting and Vomiting    Review of Systems: A 10+ ROS was performed, reviewed, and the pertinent orthopaedic findings are documented in the HPI.  I have reviewed and agree with the ROS captured by the CMA.    Physical Exam: BP 132/84   Ht 172.7 cm (5' 8)   Wt 90.4 kg (199 lb 6.4 oz)   BMI 30.32 kg/m  General/Constitutional: No apparent distress: well-nourished and well developed. Eyes: Pupils equal, round with synchronous movement. Lymphatic: No palpable adenopathy. Respiratory: Patient has good chest rise and fall with inspiration and expiration.  All lung fields are clear to auscultation bilaterally.  There is no Rales, rhonchi or wheezes appreciated. Cardiovascular: Upon auscultation there is a regular rate and rhythm without any murmurs, rubs, gallops or heaves appreciated. Integumentary: No impressive skin lesions present, except as noted in detailed  exam. Neuro/Psych: Normal mood and affect, oriented to person, place and time. Musculoskeletal: see exam below  Right Upper Extremity: He has Dupuytren's contracture involving the small and ring finger.  There is a palpable cord in the palm leading to the ring finger.  He is unable to flatten his hand fully on a tabletop.  He has about a 40 to 50 degree flexion contracture of the ring finger MCP joint and about a 30 degree flexion contracture of the PIP joint.  He has about a 30-40 degree flexion contracture of the MCP joint of the small finger and about 20 to 30 degree flexion contracture of the small finger PIP joint.  Sensation intact to light touch.  Digits are warm well-perfused. Imaging and Results: NA  Assessment & Plan: Assessment & Plan Dupuytren's contracture of right hand Chronic contracture of the right hand, primarily affecting the ring finger. Previous needle aponeurotomy over 20 years ago. Prefers less invasive approach. - Perform needle aponeurotomy in office or local surgery center in [place], depending on availability. --, Benefits, alternatives to surgery discussed with the patient including but not limited to injury to nerves, blood vessels, infection, stiffness, recurrence - Coordinate scheduling for procedure in [place] - Patient will follow-up postoperatively  Jackquline CANDIE Barrack, MD [facility] [address] [place], [place] [address] Phone: [phone]  This note was generated in part with voice recognition software; please excuse any typographical errors that were not detected and corrected. This note has been created using automated tools and reviewed for accuracy by [facility].

## 2024-02-10 ENCOUNTER — Other Ambulatory Visit: Payer: Self-pay

## 2024-02-12 NOTE — Anesthesia Preprocedure Evaluation (Signed)
 Anesthesia Evaluation    Airway        Dental   Pulmonary former smoker          Cardiovascular hypertension,      Neuro/Psych    GI/Hepatic   Endo/Other    Renal/GU      Musculoskeletal   Abdominal   Peds  Hematology   Anesthesia Other Findings Medical History  Hyperlipidemia  GERD (gastroesophageal reflux disease) COPD (chronic obstructive pulmonary disease)   Bladder neck obstruction Shortness of breath dyspnea  ADD (attention deficit disorder) Seasonal allergies  Anxiety HTN (hypertension)  Cancer [facility])    Reproductive/Obstetrics                              Anesthesia Physical Anesthesia Plan Anesthesia Quick Evaluation

## 2024-03-03 ENCOUNTER — Encounter: Payer: Self-pay | Admitting: Anesthesiology

## 2024-03-03 ENCOUNTER — Encounter: Admission: RE | Disposition: A | Discharge status: Home / Self Care | Payer: Self-pay | Source: Home / Self Care

## 2024-03-03 ENCOUNTER — Other Ambulatory Visit: Payer: Self-pay

## 2024-03-03 ENCOUNTER — Ambulatory Visit: Admission: RE | Admit: 2024-03-03 | Discharge: 2024-03-03 | Disposition: A | Discharge status: Home / Self Care

## 2024-03-03 DIAGNOSIS — M720 Palmar fascial fibromatosis [Dupuytren]: Secondary | ICD-10-CM | POA: Insufficient documentation

## 2024-03-03 HISTORY — PX: FASCIOTOMY: SHX132

## 2024-03-03 HISTORY — DX: Unspecified osteoarthritis, unspecified site: M19.90

## 2024-03-03 SURGERY — FASCIOTOMY, UPPER EXTREMITY
Anesthesia: LOCAL | Site: Hand | Laterality: Right

## 2024-03-03 MED ORDER — LACTATED RINGERS IV SOLN: INTRAVENOUS | Status: DC

## 2024-03-03 MED ORDER — LIDOCAINE HCL (PF) 1 % IJ SOLN
INTRAMUSCULAR | Status: DC | PRN
  Administered 2024-03-03: 2.5 mL

## 2024-03-03 SURGICAL SUPPLY — 11 items
BNDG ELASTIC 3X5.8 VLCR NS LF (GAUZE/BANDAGES/DRESSINGS) IMPLANT
CHLORAPREP W/TINT 26 (MISCELLANEOUS) IMPLANT
GAUZE SPONGE 4X4 12PLY STRL (GAUZE/BANDAGES/DRESSINGS) IMPLANT
GAUZE XEROFORM 1X8 LF (GAUZE/BANDAGES/DRESSINGS) IMPLANT
GLOVE PI ULTRA LF STRL 7.5 (GLOVE) IMPLANT
GOWN STRL REUS W/ TWL LRG LVL3 (GOWN DISPOSABLE) ×1 IMPLANT
NDL HYPO 25GX1X1/2 BEV (NEEDLE) IMPLANT
NEEDLE HYPO 25GX1X1/2 BEV (NEEDLE) ×1 IMPLANT
PACK EXTREMITY ARMC (MISCELLANEOUS) ×1 IMPLANT
SPONGE T-LAP 18X18 ~~LOC~~+RFID (SPONGE) IMPLANT
SYR 5ML 18GX1 1/2 (NEEDLE) IMPLANT

## 2024-03-03 NOTE — OR Nursing (Signed)
 Patient is not receiving anesthesia for surgery. Vitals are recorded below:   1215 BP: 127/85 (97) HR: 71 O2: 93%   1220 BP: 148/76 (93) HR: 73 O2: 93%  1225 BP: 144/80 (100) HR: 77 O2: 93%   1230 BP: 161/92 (107) HR: 83 O2: 94%  1235 BP: 167/97 (119) HR: 64 O2: 95%  1240 BP: 160/104 (120) HR: 74 O2: 95%  1245 BP: 155/71 (94) HR: 65 O2: 95%    Morna SHAUNNA Arenas, RN

## 2024-03-03 NOTE — Interval H&P Note (Signed)
 History and Physical Interval Note:  03/03/2024 12:06 PM  Arthur Owens  has presented today for surgery, with the diagnosis of Dupuytren's contracture of right hand M72.0.  The various methods of treatment have been discussed with the patient and family. After consideration of risks, benefits and other options for treatment, the patient has consented to  Procedure(s) with comments: FASCIOTOMY, UPPER EXTREMITY (Right) - Right hand percutaneous needle aponeurotomy as a surgical intervention.  The patient's history has been reviewed, patient examined, no change in status, stable for surgery.  I have reviewed the patient's chart and labs.  Questions were answered to the patient's satisfaction.     Jackquline GORMAN Barrack

## 2024-03-03 NOTE — Discharge Instructions (Signed)
 Discharge Instructions After Surgery   Diet   Return to your regular diet as tolerated.   Dressings   You may remove your dressings after 3-4 days.  Okay to shower at this point but do not soak in water (baths, swimming, etc).  It is okay to have a small spot of blood on your dressing as long as it does not keep getting bigger.   In the first 3-4 days, cover the dressing and keep it dry in the shower or bath. Keep your arm up and out of the water.   Do not put any ointments, creams, alcohol , or hydrogen peroxide on your incisions.  Activity   If your fingers are free, work on making a full fist and straightening out the fingers within the limits of your dressing.  Move other joints such as your elbow or shoulder multiple times a day to prevent getting stiff.  No lifting, carrying, pushing, or pulling with your arm until your follow-up visit. You may use your hand or fingers for simple things like writing, typing, eating, and brushing your teeth.   If you were given a sling, wear it while your hand or arm is numb. You can stop wearing the sling when you have feeling and strength back in your hand or arm. You may also use the sling as needed to elevate, protect, or support your arm until your follow-up visit.   Pain   The first 48 hours after surgery can hurt the most. You should use the different types of pain medicine along with rest and elevation to help keep the pain manageable.   NSAIDS such as ibuprofen (Advil or Motrin) or naproxen  (Aleve ) can help with pain and swelling.   Acetaminophen  (Tylenol ) can be used with NSAIDs. Do not take more than 3000 mg of Tylenol  in a 24-hour period.   Narcotic pain medicine such as hydrocodone  or oxycodone , should be used as prescribed. Stop taking as soon as possible. Take with food to help prevent nausea. Do not drink alcohol  or drive while taking narcotics. Be aware that hydrocodone  tablets often already contain acetaminophen  (Tylenol ).   Elevating your operative extremity above the level of your heart will help with pain and swelling. You can rest your arm on several pillows for support while sleeping or resting.   Constipation   Narcotic pain medicines, changes in eating and drinking, and less activity may cause constipation after surgery.   Over-the-counter stool softeners like docusate (Colace), Senna, or Miralax can help. Follow the directions on the bottle.   Stay hydrated and try to eat high fiber foods.   When to call your surgeon   If your dressing gets wet or very dirty.   Fever more than 101 F.   Incision that is very red, swollen, draining pus, or feels hot.   Severe pain, not getting better with pain medicine.   Severe swelling, even with elevation.   If your fingers become cool, cold, or dark in color.   Bleeding that is getting bigger or soaking through the dressing.   Severe nausea and vomiting.   Problems using the bathroom or no bowel movement for 2-3 days.   For questions or concerns, please call [phone].  Jackquline CANDIE Barrack, MD Orthopaedic Surgery [facility]

## 2024-03-03 NOTE — Op Note (Signed)
 Date of procedure:  03/03/2024  Surgeon:  Jackquline CANDIE Barrack, MD   Procedure(s) performed:   Procedure(s) with comments: FASCIOTOMY, UPPER EXTREMITY (Right) - Right hand percutaneous needle aponeurotomy (CPT [phone])  Assistant(s): none   Preoperative diagnosis:   Dupuytren's contracture of right hand M72.0    Postoperative diagnosis:   Dupuytren's contracture of right hand M72.0    Procedure findings:  As above  Anesthesia:  Local   Estimated blood loss:  Minimal   Specimen:  None   Complications:  None apparent   Implants: None   Indications: 83 y.o. year old male with right Dupuytren's contracture of the right hand affecting primarily the small and ring fingers.  Discussed various treatment options including percutaneous needle aponeurotomy versus fasciectomy.  The patient elected to proceed with PNA.  We discussed operative treatment and explained the risks, benefits, and alternatives as well as the expected postoperative course.  The patient elected to proceed with surgery.   Procedure in detail:  The patient was met in the pre-op holding area and the right upper extremity was marked and the consent confirme.  The patient was then taken to the operating room and the hand table was attached to the stretcher.  All bony prominences were well padded.  The operative extremity was then prepped and draped in the usual sterile fashion.  A timeout was performed confirming the correct patient, site, and procedure.   Local anesthetic consisting of 1% lidocaine  plain was administered in a small amount over top the cord in the distal palm.  I began with the cord going to the ring finger.  An 18-gauge needle was then used to percutaneously release the cord and the ring finger was extended which also helped break the cord.  The same process was repeated for the small finger cord over the distal palm.  This was repeated distally for both the ring finger and small finger cords.  A small skin tear  occurred in the distal palm of the ring finger during extension of the digit.  At the conclusion of the case, the small finger was able to be extended to 0 degrees at the MCP joint and about 10 degrees to the PIP joint.  The ring finger was also able to be extended to 0 degrees at the MCP P joint with about a residual 5 to 10 degree PIP flexion contracture.   Sterile dressings were applied.  The patient was transferred to the PACU in stable condition.

## 2024-03-09 DIAGNOSIS — J449 Chronic obstructive pulmonary disease, unspecified: Secondary | ICD-10-CM | POA: Diagnosis not present

## 2024-03-19 ENCOUNTER — Encounter: Payer: Self-pay | Admitting: Urology

## 2024-04-06 DIAGNOSIS — M898X6 Other specified disorders of bone, lower leg: Secondary | ICD-10-CM | POA: Diagnosis not present

## 2024-04-07 DIAGNOSIS — H353221 Exudative age-related macular degeneration, left eye, with active choroidal neovascularization: Secondary | ICD-10-CM | POA: Diagnosis not present

## 2024-04-07 DIAGNOSIS — Z961 Presence of intraocular lens: Secondary | ICD-10-CM | POA: Diagnosis not present

## 2024-04-07 DIAGNOSIS — H353211 Exudative age-related macular degeneration, right eye, with active choroidal neovascularization: Secondary | ICD-10-CM | POA: Diagnosis not present

## 2024-05-10 NOTE — Progress Notes (Unsigned)
 "   05/11/2024 9:06 AM   Arthur Owens [birth year] [id number]  Referring provider: Steva Clotilda DEL, NP [address] [place],  [place] [address]  Urological history: 1. BPH with LU TS -PSA (10/2021) 0.53 -tamsulosin  0.4 mg, 2 daily and finasteride  5 mg daily  2. Urge incontinence -contributing factors of age, BPH, HTN, sleep apnea and COPD -Gemtesa  75 mg cost prohibitive -Myrbetriq  50 mg cost prohibitive  3. Urinary retention - secondary to BPH (2017) - was using CIC for a time  - resolved w/ medications  HPI: Arthur Owens is a 84 y.o. male who presents today for one year for follow up.   Previous records reviewed.      I PSS 9/1  He reports urinary frequency, urinary intermittency, urinary urgency, a weak urinary stream, and nocturia x 3.  He has some urge incontinence, but it is not bothersome.  Patient denies any modifying or aggravating factors.  Patient denies any recent UTI's, gross hematuria, dysuria or suprapubic/flank pain.  Patient denies any fevers, chills, nausea or vomiting.    PSA aged out of screening   Serum creatinine (11/2023) 1.2, eGFR 60  Hemoglobin A1c (11/2023) 6.1  Diuretics: hydrochlorothiazide   BPH meds: finasteride  5 mg daily and tamsulosin  0.4 mg, 2 daily    PMH: Past Medical History:  Diagnosis Date   ADD (attention deficit disorder)    Anxiety    Arthritis    Bladder neck obstruction    Cancer (HCC)    H/O ADENOMATOUS POLYP OF COLON   COPD (chronic obstructive pulmonary disease) (HCC)    Stage I   GERD (gastroesophageal reflux disease)    HTN (hypertension)    Hyperlipidemia    Seasonal allergies    Shortness of breath dyspnea    Syncope and collapse 08/19/2020    Surgical History: Past Surgical History:  Procedure Laterality Date   adentamous colon polyp     APPENDECTOMY     CATARACT EXTRACTION W/PHACO Left 01/08/2022   Procedure: CATARACT EXTRACTION PHACO AND INTRAOCULAR LENS PLACEMENT (IOC) LEFT 11.09 00:57.6;   Surgeon: Myrna Adine Anes, MD;  Location: [facility];  Service: Ophthalmology;  Laterality: Left;   CATARACT EXTRACTION W/PHACO Right 01/22/2022   Procedure: CATARACT EXTRACTION PHACO AND INTRAOCULAR LENS PLACEMENT (IOC) RIGHT 13.52 01:05.6;  Surgeon: Myrna Adine Anes, MD;  Location: [facility];  Service: Ophthalmology;  Laterality: Right;   COLONOSCOPY WITH PROPOFOL  N/A 01/28/2015   Procedure: COLONOSCOPY WITH PROPOFOL ;  Surgeon: Lamar ONEIDA Holmes, MD;  Location: [facility] ENDOSCOPY;  Service: Endoscopy;  Laterality: N/A;   COLONOSCOPY WITH PROPOFOL  N/A 09/23/2020   Procedure: COLONOSCOPY WITH PROPOFOL ;  Surgeon: Maryruth Ole ONEIDA, MD;  Location: [facility] ENDOSCOPY;  Service: Endoscopy;  Laterality: N/A;   ESOPHAGOGASTRODUODENOSCOPY (EGD) WITH PROPOFOL  N/A 01/28/2015   Procedure: ESOPHAGOGASTRODUODENOSCOPY (EGD) WITH PROPOFOL ;  Surgeon: Lamar ONEIDA Holmes, MD;  Location: [facility] ENDOSCOPY;  Service: Endoscopy;  Laterality: N/A;   FASCIOTOMY Right 03/03/2024   Procedure: FASCIOTOMY, UPPER EXTREMITY;  Surgeon: Ezra Jackquline RAMAN, MD;  Location: [facility];  Service: Orthopedics;  Laterality: Right;  Right hand percutaneous needle aponeurotomy   HERNIA REPAIR Left    LEFT INGUINAL   left humerus debridement Left    TONSILLECTOMY     TRIGGER FINGER RELEASE Left 04/15/2015   Procedure: LEFT RING FINGER DUPUYTREN RELEASE;  Surgeon: Helayne Glenn, MD;  Location: [facility];  Service: Orthopedics;  Laterality: Left;    Home Medications:  Allergies as of 05/11/2024  Reactions   Hydrocodone  Bit-homatrop Mbr Nausea And Vomiting        Medication List        Accurate as of May 11, 2024  9:06 AM. If you have any questions, ask your nurse or doctor.          albuterol  108 (90 Base) MCG/ACT inhaler Commonly known as: VENTOLIN  HFA Inhale 2 puffs into the lungs every 6 (six) hours as needed for wheezing or shortness of breath.   atorvastatin 40 MG  tablet Commonly known as: LIPITOR Take 40 mg by mouth daily. PM   azelastine 0.1 % nasal spray Commonly known as: ASTELIN Place 1 spray into both nostrils 2 (two) times daily.   cetirizine 10 MG tablet Commonly known as: ZYRTEC Take 10 mg by mouth daily. AM   cyanocobalamin 1000 MCG tablet Commonly known as: VITAMIN B12 Take 1,000 mcg by mouth daily.   famotidine 20 MG tablet Commonly known as: PEPCID Take 20 mg by mouth daily.   finasteride  5 MG tablet Commonly known as: PROSCAR  Take 1 tablet (5 mg total) by mouth daily. Appt needed for further refills   hydrochlorothiazide 12.5 MG tablet Commonly known as: HYDRODIURIL Take 12.5 mg by mouth daily. AM   losartan 25 MG tablet Commonly known as: COZAAR Take 1 tablet by mouth daily.   Spiriva HandiHaler 18 MCG Caps Generic drug: Tiotropium Bromide Place 18 mcg into inhaler and inhale as needed.   tamsulosin  0.4 MG Caps capsule Commonly known as: FLOMAX  Take 1 capsule (0.4 mg total) by mouth daily. EVE   Vitamin D3 50 MCG (2000 UT) capsule Take 2,000 Units by mouth daily.   vitamin E 180 MG (400 UNITS) capsule Take 400 Units by mouth daily.        Allergies:  Allergies  Allergen Reactions   Hydrocodone  Bit-Homatrop Mbr Nausea And Vomiting    Family History: Family History  Problem Relation Age of Onset   Colon cancer Father    Prostate cancer Father    Stroke Mother    Diabetes Other    Hyperlipidemia Other    Glaucoma Other    Kidney disease Neg Hx    Kidney cancer Neg Hx    Bladder Cancer Neg Hx     Social History:  reports that he quit smoking about 16 years ago. His smoking use included cigarettes. He has never used smokeless tobacco. He reports that he does not currently use alcohol . He reports that he does not use drugs.  ROS: For pertinent review of systems please refer to history of present illness  Physical Exam: BP 135/75   Pulse 80   Wt 190 lb (86.2 kg)   SpO2 98%   BMI 28.89  kg/m   Constitutional:  Well nourished. Alert and oriented, No acute distress. HEENT: [place] AT, moist mucus membranes.  Trachea midline Cardiovascular: No clubbing, cyanosis, or edema. Respiratory: Normal respiratory effort, no increased work of breathing. Neurologic: Grossly intact, no focal deficits, moving all 4 extremities. Psychiatric: Normal mood and affect.   Laboratory Data: See EPIC and HPI I have reviewed the labs.    Pertinent Imaging: N/A  Assessment & Plan:    1. BPH with LU TS - moderate severe symptoms and he is pleased - PSADRE aged out of screening  - encouraged avoiding bladder irritants, fluid restriction before bedtime and timed voiding's - Continue tamsulosin  0.4 mg, 2 tablets daily and finasteride  5 mg daily   Return in about 1 year (around 05/11/2025)  for I PSS .  CLOTILDA HELON RIGGERS  [facility] [address] [place], [place] [address] [phone] "

## 2024-05-11 ENCOUNTER — Encounter: Payer: Self-pay | Admitting: Urology

## 2024-05-11 ENCOUNTER — Ambulatory Visit: Payer: Self-pay | Admitting: Urology

## 2024-05-11 VITALS — BP 135/75 | HR 80 | Wt 190.0 lb

## 2024-05-11 DIAGNOSIS — N401 Enlarged prostate with lower urinary tract symptoms: Secondary | ICD-10-CM

## 2024-05-11 DIAGNOSIS — N138 Other obstructive and reflux uropathy: Secondary | ICD-10-CM | POA: Diagnosis not present

## 2024-05-29 ENCOUNTER — Other Ambulatory Visit: Payer: Self-pay | Admitting: Internal Medicine

## 2024-05-29 DIAGNOSIS — R9430 Abnormal result of cardiovascular function study, unspecified: Secondary | ICD-10-CM

## 2024-05-29 DIAGNOSIS — R0789 Other chest pain: Secondary | ICD-10-CM

## 2024-06-15 ENCOUNTER — Ambulatory Visit

## 2025-05-11 ENCOUNTER — Ambulatory Visit: Admitting: Urology
